# Patient Record
Sex: Female | Born: 1980 | Race: Black or African American | Hispanic: No | Marital: Single | Smoking: Never smoker
Health system: Southern US, Community
[De-identification: ages and names within clinical notes are randomized; demographics above are authoritative.]

## PROBLEM LIST (undated history)

## (undated) DIAGNOSIS — B009 Herpesviral infection, unspecified: Secondary | ICD-10-CM

## (undated) DIAGNOSIS — F909 Attention-deficit hyperactivity disorder, unspecified type: Secondary | ICD-10-CM

## (undated) HISTORY — PX: HYSTEROSCOPY: SHX211

## (undated) HISTORY — PX: OTHER SURGICAL HISTORY: SHX169

## (undated) HISTORY — DX: Herpesviral infection, unspecified: B00.9

---

## 2000-06-16 ENCOUNTER — Other Ambulatory Visit: Admission: RE | Admit: 2000-06-16 | Discharge: 2000-06-16 | Payer: Self-pay | Admitting: Gynecology

## 2001-10-17 ENCOUNTER — Other Ambulatory Visit: Admission: RE | Admit: 2001-10-17 | Discharge: 2001-10-17 | Payer: Self-pay | Admitting: Gynecology

## 2002-10-31 ENCOUNTER — Other Ambulatory Visit: Admission: RE | Admit: 2002-10-31 | Discharge: 2002-10-31 | Payer: Self-pay | Admitting: Obstetrics and Gynecology

## 2004-12-05 ENCOUNTER — Other Ambulatory Visit: Admission: RE | Admit: 2004-12-05 | Discharge: 2004-12-05 | Payer: Self-pay | Admitting: Gynecology

## 2006-07-01 ENCOUNTER — Other Ambulatory Visit: Admission: RE | Admit: 2006-07-01 | Discharge: 2006-07-01 | Payer: Self-pay | Admitting: Gynecology

## 2010-03-02 HISTORY — PX: INTRAUTERINE DEVICE INSERTION: SHX323

## 2011-09-22 ENCOUNTER — Encounter: Payer: Self-pay | Admitting: *Deleted

## 2011-09-22 DIAGNOSIS — B009 Herpesviral infection, unspecified: Secondary | ICD-10-CM | POA: Insufficient documentation

## 2011-09-23 ENCOUNTER — Encounter: Payer: Self-pay | Admitting: Women's Health

## 2011-09-23 ENCOUNTER — Ambulatory Visit (INDEPENDENT_AMBULATORY_CARE_PROVIDER_SITE_OTHER): Payer: 59 | Admitting: Women's Health

## 2011-09-23 VITALS — BP 130/70 | Ht 69.0 in | Wt 186.0 lb

## 2011-09-23 DIAGNOSIS — Z975 Presence of (intrauterine) contraceptive device: Secondary | ICD-10-CM

## 2011-09-23 DIAGNOSIS — N898 Other specified noninflammatory disorders of vagina: Secondary | ICD-10-CM

## 2011-09-23 DIAGNOSIS — IMO0001 Reserved for inherently not codable concepts without codable children: Secondary | ICD-10-CM | POA: Insufficient documentation

## 2011-09-23 LAB — WET PREP FOR TRICH, YEAST, CLUE: Clue Cells Wet Prep HPF POC: NONE SEEN

## 2011-09-23 MED ORDER — TERCONAZOLE 0.8 % VA CREA: 1.0000 | TOPICAL_CREAM | Freq: Every day | VAGINAL | Status: AC

## 2011-09-23 NOTE — Progress Notes (Signed)
Patient ID: Renee Clark, female   DOB: [birth year], 31 y.o.   MRN: [id number] Presents with the complaint of vaginal discharge with irritation/ discomfort especially after intercourse. Having regular monthly 6 day cycle first 2 days heavy flow, ParaGard IUD placed 2012. Last seen in our office in 2008, has delivered 2 sons since that time ages 42 months and 3. Third child weighed 9 pounds. Denies discharge with an odor or itching. States Pap smear after delivery was ascus, with no treatment, will have records sent.  Exam: +1 asymptomatic cystocele. External genitalia slightly erythematous, speculum exam ParaGard IUD strings visible, wet prep positive for moderate yeast. Bimanual no CMT or adnexal fullness or tenderness.  Yeast vaginitis  Plan: Terazol 3 one applicator at bedtime x3, prescription, proper use given and reviewed. Schedule annual exam and have records sent. Instructed  to call if no relief.

## 2011-10-07 ENCOUNTER — Encounter: Payer: Self-pay | Admitting: Women's Health

## 2011-10-07 ENCOUNTER — Ambulatory Visit (INDEPENDENT_AMBULATORY_CARE_PROVIDER_SITE_OTHER): Payer: 59 | Admitting: Women's Health

## 2011-10-07 ENCOUNTER — Other Ambulatory Visit (HOSPITAL_COMMUNITY)
Admission: RE | Admit: 2011-10-07 | Discharge: 2011-10-07 | Disposition: A | Discharge status: Home / Self Care | Payer: 59 | Source: Ambulatory Visit | Attending: Women's Health | Admitting: Women's Health

## 2011-10-07 VITALS — BP 128/74

## 2011-10-07 DIAGNOSIS — Z1151 Encounter for screening for human papillomavirus (HPV): Secondary | ICD-10-CM | POA: Insufficient documentation

## 2011-10-07 DIAGNOSIS — Z01419 Encounter for gynecological examination (general) (routine) without abnormal findings: Secondary | ICD-10-CM | POA: Insufficient documentation

## 2011-10-07 LAB — CBC WITH DIFFERENTIAL/PLATELET
Basophils Relative: 0 % (ref 0–1)
HCT: 36.8 % (ref 36.0–46.0)
Hemoglobin: 12.2 g/dL (ref 12.0–15.0)
Lymphocytes Relative: 41 % (ref 12–46)
Monocytes Absolute: 0.4 10*3/uL (ref 0.1–1.0)
Monocytes Relative: 7 % (ref 3–12)
Neutro Abs: 2.5 10*3/uL (ref 1.7–7.7)
Neutrophils Relative %: 51 % (ref 43–77)
RBC: 4.14 MIL/uL (ref 3.87–5.11)
WBC: 4.9 10*3/uL (ref 4.0–10.5)

## 2011-10-07 NOTE — Patient Instructions (Addendum)

## 2011-10-07 NOTE — Addendum Note (Signed)
Addended byValeda Malm L on: 10/07/2011 04:52 PM   Modules accepted: Orders

## 2011-10-07 NOTE — Progress Notes (Signed)
Antonina Deziel 22-Jan-1981 [id number]    History:    The patient presents for annual exam.  Monthly 4-5 day cycle ParaGard IUD 2012. Pap ascus with negative HR HPV 2012, Paps prior normal. History of HSV/rare outbreaks.   Past medical history, past surgical history, family history and social history were all reviewed and documented in the EPIC chart. 3 children, Aria 8, Adam 3, Caleb 14 months all doing well. Works from home.   ROS:  A  ROS was performed and pertinent positives and negatives are included in the history.  Exam:  Filed Vitals:   10/07/11 1518  BP: 128/74    General appearance:  Normal Head/Neck:  Normal, without cervical or supraclavicular adenopathy. Thyroid:  Symmetrical, normal in size, without palpable masses or nodularity. Respiratory  Effort:  Normal  Auscultation:  Clear without wheezing or rhonchi Cardiovascular  Auscultation:  Regular rate, without rubs, murmurs or gallops  Edema/varicosities:  Not grossly evident Abdominal  Soft,nontender, without masses, guarding or rebound.  Liver/spleen:  No organomegaly noted  Hernia:  None appreciated  Skin  Inspection:  Grossly normal  Palpation:  Grossly normal Neurologic/psychiatric  Orientation:  Normal with appropriate conversation.  Mood/affect:  Normal  Genitourinary    Breasts: Examined lying and sitting.     Right: Without masses, retractions, discharge or axillary adenopathy.     Left: Without masses, retractions, discharge or axillary adenopathy.   Inguinal/mons:  Normal without inguinal adenopathy  External genitalia:  Normal  BUS/Urethra/Skene's glands:  Normal  Bladder:  Normal  Vagina:  Normal  Cervix:  Normal/IUD strings visible  Uterus:  normal in size, shape and contour.  Midline and mobile  Adnexa/parametria:     Rt: Without masses or tenderness.   Lt: Without masses or tenderness.  Anus and perineum: Normal  Digital rectal exam: Normal sphincter tone without palpated masses or  tenderness  Assessment/Plan:  31 y.o. MBF G5 P3 for annual exam with no complaints.  History of ascus with negative HR HPV 2012, Paps normal prior ParaGard IUD 2012  Plan: Reviewed new screening guidelines to repeat Pap in 2 years , Patient concerned and requested to have repeated today. Pap done reviewed if normal repeat Pap in 3 years. SBE's, exercise, calcium rich diet, vitamin D 1000 daily encouraged. CBC, UAHarrington Challenger [facility], 4:38 PM 10/07/2011

## 2011-10-08 LAB — URINALYSIS W MICROSCOPIC + REFLEX CULTURE
Bilirubin Urine: NEGATIVE
Crystals: NONE SEEN
Glucose, UA: NEGATIVE mg/dL
Leukocytes, UA: NEGATIVE
Protein, ur: NEGATIVE mg/dL
Specific Gravity, Urine: 1.016 (ref 1.005–1.030)
Squamous Epithelial / LPF: NONE SEEN
Urobilinogen, UA: 0.2 mg/dL (ref 0.0–1.0)

## 2012-03-29 ENCOUNTER — Ambulatory Visit (INDEPENDENT_AMBULATORY_CARE_PROVIDER_SITE_OTHER): Payer: 59 | Admitting: Gynecology

## 2012-03-29 ENCOUNTER — Encounter: Payer: Self-pay | Admitting: Gynecology

## 2012-03-29 DIAGNOSIS — N751 Abscess of Bartholin's gland: Secondary | ICD-10-CM

## 2012-03-29 DIAGNOSIS — N764 Abscess of vulva: Secondary | ICD-10-CM

## 2012-03-29 MED ORDER — HYDROCODONE-ACETAMINOPHEN 5-500 MG PO TABS: 1.0000 | ORAL_TABLET | Freq: Four times a day (QID) | ORAL | Status: DC | PRN

## 2012-03-29 MED ORDER — MUPIROCIN CALCIUM 2 % EX CREA: TOPICAL_CREAM | Freq: Three times a day (TID) | CUTANEOUS | Status: DC

## 2012-03-29 MED ORDER — DOXYCYCLINE HYCLATE 100 MG PO CAPS: ORAL_CAPSULE | ORAL | Status: DC

## 2012-03-29 NOTE — Patient Instructions (Addendum)
Abscess An abscess is an infected area that contains a collection of pus and debris. It can occur in almost any part of the body. An abscess is also known as a furuncle or boil. CAUSES   An abscess occurs when tissue gets infected. This can occur from blockage of oil or sweat glands, infection of hair follicles, or a minor injury to the skin. As the body tries to fight the infection, pus collects in the area and creates pressure under the skin. This pressure causes pain. People with weakened immune systems have difficulty fighting infections and get certain abscesses more often.   SYMPTOMS Usually an abscess develops on the skin and becomes a painful mass that is red, warm, and tender. If the abscess forms under the skin, you may feel a moveable soft area under the skin. Some abscesses break open (rupture) on their own, but most will continue to get worse without care. The infection can spread deeper into the body and eventually into the bloodstream, causing you to feel ill.   DIAGNOSIS   Your caregiver will take your medical history and perform a physical exam. A sample of fluid may also be taken from the abscess to determine what is causing your infection. TREATMENT   Your caregiver may prescribe antibiotic medicines to fight the infection. However, taking antibiotics alone usually does not cure an abscess. Your caregiver may need to make a small cut (incision) in the abscess to drain the pus. In some cases, gauze is packed into the abscess to reduce pain and to continue draining the area. HOME CARE INSTRUCTIONS    Only take over-the-counter or prescription medicines for pain, discomfort, or fever as directed by your caregiver.   If you were prescribed antibiotics, take them as directed. Finish them even if you start to feel better.   If gauze is used, follow your caregiver's directions for changing the gauze.   To avoid spreading the infection:   Keep your draining abscess covered with a  bandage.   Wash your hands well.   Do not share personal care items, towels, or whirlpools with others.   Avoid skin contact with others.   Keep your skin and clothes clean around the abscess.   Keep all follow-up appointments as directed by your caregiver.  SEEK MEDICAL CARE IF:    You have increased pain, swelling, redness, fluid drainage, or bleeding.   You have muscle aches, chills, or a general ill feeling.   You have a fever.  MAKE SURE YOU:    Understand these instructions.   Will watch your condition.   Will get help right away if you are not doing well or get worse.  Document Released: 11/26/2004 Document Revised: 08/18/2011 Document Reviewed: 05/01/2011 Exit[employer] Patient Information 2013 [employer].    

## 2012-03-30 NOTE — Progress Notes (Addendum)
Patient is a 32 year old who presented to the office today complaining of right vaginal swelling and tenderness. Patient stated started Friday and has gotten worse. She denied any fever. She has a ParaGard IUD for contraception. She had a normal gynecological examination August 2013.  Exam: A large right inferior labial majora abscess measuring about 3 x 4 cm erythematous with a small center defect with purulent material begin to drain.  The area was cleansed with Betadine solution. 1% lidocaine was infiltrated subdermally and a small stab incision was made with a sterile scalpel and pertinent material begin to extrude. Cultures for MRSA were obtained. With a sterile curved hemostat the loculations were broken down. The defect was copiously irrigated with hydrogen peroxide. A small thin new gauze was inserted into the defect for additional hemostasis.  Assessment/plan: Right labial at this status post I&D in the office today. Patient will remove the new gauze wick tomorrow at home. She will be started on Vibramycin 100 mg twice a day for 2 weeks. She will clean the area twice a day with antibacterial soap and applied Bactroban or Neosporin cream to 3 times a day. She will apply ice pack to the area today and tomorrow. She will return back to the office in 2 weeks for followup.

## 2012-03-31 ENCOUNTER — Encounter: Payer: Self-pay | Admitting: Women's Health

## 2012-03-31 ENCOUNTER — Telehealth: Payer: Self-pay

## 2012-03-31 ENCOUNTER — Ambulatory Visit (INDEPENDENT_AMBULATORY_CARE_PROVIDER_SITE_OTHER): Payer: 59 | Admitting: Women's Health

## 2012-03-31 DIAGNOSIS — R110 Nausea: Secondary | ICD-10-CM

## 2012-03-31 DIAGNOSIS — L0390 Cellulitis, unspecified: Secondary | ICD-10-CM

## 2012-03-31 DIAGNOSIS — L0291 Cutaneous abscess, unspecified: Secondary | ICD-10-CM

## 2012-03-31 MED ORDER — ONDANSETRON HCL 4 MG PO TABS: 4.0000 mg | ORAL_TABLET | Freq: Three times a day (TID) | ORAL | Status: DC | PRN

## 2012-03-31 NOTE — Telephone Encounter (Signed)
Per Dr. Glenetta Hew staff message request: Please inform patient that her culture for her labial abscess demonstrated Staphylococcus aureus and she is on the right antibiotic. Tell her that I hope that she is feeling better and that she was able to remove the gauze wick and is taking her antibiotic as instructed. She is scheduled to see me in 2 weeks.   Patient was informed and proceeded to explain that she is feeling bad. 101 fever last night. Her labia is swollen as well as some redness and swelling in the pelvic area.  She had called earlier and is scheduled to see [place] at 3:00pm  I checked with Dr. Glenetta Hew to see if that was okay with him as he did procedure and he said that would be okay that Renee Clark could get him to come in the visit if she needed him to.  Patient advised we will see her at 3pm.

## 2012-03-31 NOTE — Progress Notes (Signed)
Patient ID: Renee Clark, female   DOB: 1980/03/09, 32 y.o.   MRN: [id number] Presents with complaint of increased swelling and discomfort in right labia extending to suprapubic area. States has had nausea and low-grade fever and has used Tylenol. Vomited one time. Taking Vibramycin twice daily. I&D right labial abscess 03/28/12 per Dr. Lily Peer.  Exam: Appears well. Dr. Lily Peer in and examined. Right labia edematous, tender, tissue soft extending to suprapubic area, small amount of a yellow drainage noted, no odor. Less erythematous then 1/27.  Right labial abscess culture positive for staph aureus  Plan: Continue Vibramycin 100 twice daily for 2 weeks. Prescription for Zofran 4 mg if continued nausea, prescription, proper use given and reviewed. Return to office in 4 days for followup. Instructed to continue sitz baths twice daily alternate with ice, Bactroban to open area, loose clothes.

## 2012-04-01 LAB — WOUND CULTURE

## 2012-04-06 ENCOUNTER — Telehealth: Payer: Self-pay | Admitting: *Deleted

## 2012-04-06 NOTE — Telephone Encounter (Signed)
Telephone call, states took one of the capsules and feels like it did not go down the entire way and has had a discomfort in the esophagus for several days, will try over-the-counter Maalox instructed to call if symptoms persist. No nausea or vomiting. States labial swelling has gone down tremendously and area less painful.

## 2012-04-06 NOTE — Telephone Encounter (Signed)
Pt is currently taking Vibramycin 100 mg twice a day for 2 weeks, pt took a pill later in the evening and thinks that it didn't dissolve completely. Pt is now having discomfort when eating & drinking. She also has some chest discomfort that feels like heartburn, but no burning this is what she could relate the discomfort to. Pt asked if there is anything she could do relieve this? Please advise

## 2012-04-09 ENCOUNTER — Emergency Department (HOSPITAL_COMMUNITY): Payer: 59

## 2012-04-09 ENCOUNTER — Other Ambulatory Visit: Payer: Self-pay

## 2012-04-09 ENCOUNTER — Emergency Department (HOSPITAL_COMMUNITY)
Admission: EM | Admit: 2012-04-09 | Discharge: 2012-04-09 | Disposition: A | Discharge status: Home / Self Care | Payer: 59 | Attending: Emergency Medicine | Admitting: Emergency Medicine

## 2012-04-09 ENCOUNTER — Encounter (HOSPITAL_COMMUNITY): Payer: Self-pay | Admitting: *Deleted

## 2012-04-09 DIAGNOSIS — R059 Cough, unspecified: Secondary | ICD-10-CM | POA: Insufficient documentation

## 2012-04-09 DIAGNOSIS — K2970 Gastritis, unspecified, without bleeding: Secondary | ICD-10-CM | POA: Insufficient documentation

## 2012-04-09 DIAGNOSIS — B009 Herpesviral infection, unspecified: Secondary | ICD-10-CM | POA: Insufficient documentation

## 2012-04-09 DIAGNOSIS — K2990 Gastroduodenitis, unspecified, without bleeding: Secondary | ICD-10-CM | POA: Insufficient documentation

## 2012-04-09 DIAGNOSIS — K209 Esophagitis, unspecified without bleeding: Secondary | ICD-10-CM | POA: Insufficient documentation

## 2012-04-09 DIAGNOSIS — R0602 Shortness of breath: Secondary | ICD-10-CM | POA: Insufficient documentation

## 2012-04-09 DIAGNOSIS — Z792 Long term (current) use of antibiotics: Secondary | ICD-10-CM | POA: Insufficient documentation

## 2012-04-09 DIAGNOSIS — R05 Cough: Secondary | ICD-10-CM | POA: Insufficient documentation

## 2012-04-09 DIAGNOSIS — Z79899 Other long term (current) drug therapy: Secondary | ICD-10-CM | POA: Insufficient documentation

## 2012-04-09 LAB — CBC
Hemoglobin: 12.6 g/dL (ref 12.0–15.0)
MCH: 28.9 pg (ref 26.0–34.0)
MCHC: 32.9 g/dL (ref 30.0–36.0)
MCV: 87.8 fL (ref 78.0–100.0)
RBC: 4.36 MIL/uL (ref 3.87–5.11)

## 2012-04-09 LAB — BASIC METABOLIC PANEL
BUN: 9 mg/dL (ref 6–23)
CO2: 23 mEq/L (ref 19–32)
Calcium: 9.6 mg/dL (ref 8.4–10.5)
Creatinine, Ser: 0.71 mg/dL (ref 0.50–1.10)
GFR calc non Af Amer: >90 mL/min (ref >90)
Glucose, Bld: 93 mg/dL (ref 70–99)

## 2012-04-09 MED ORDER — SUCRALFATE 1 GM/10ML PO SUSP: 1.0000 g | Freq: Four times a day (QID) | ORAL | Status: DC

## 2012-04-09 MED ORDER — GI COCKTAIL ~~LOC~~
30.0000 mL | Freq: Once | ORAL | Status: AC
  Administered 2012-04-09: 30 mL via ORAL
  Filled 2012-04-09: qty 30

## 2012-04-09 MED ORDER — RANITIDINE HCL 150 MG PO TABS: 150.0000 mg | ORAL_TABLET | Freq: Two times a day (BID) | ORAL | Status: DC

## 2012-04-09 NOTE — ED Notes (Signed)
Pt states understanding of discharge instructions 

## 2012-04-09 NOTE — ED Provider Notes (Signed)
History     CSN: [id number]  Arrival date & time 04/09/12  1455   First MD Initiated Contact with Patient 04/09/12 1929      Chief Complaint  Patient presents with  . Chest Pain  . Shortness of Breath    (Consider location/radiation/quality/duration/timing/severity/associated sxs/prior treatment) Patient is a 32 y.o. female presenting with chest pain and shortness of breath. The history is provided by the patient.  Chest Pain Pain location:  Substernal area and epigastric Pain quality: aching, burning and sharp   Pain radiates to:  Does not radiate Pain radiates to the back: no   Pain severity:  Severe Onset quality:  Gradual Duration:  5 days Timing:  Constant Progression:  Worsening Chronicity:  New (Started 5 days ago after she felt like her doxycycline tablet stuck in her throat) Context: eating   Context comment:  Every time she eats and swallows she has a burning and gravelly sensation in her distal chest and epigastric area Relieved by: Attempted Maalox with little help. Exacerbated by: Eating. Ineffective treatments: maalox. Associated symptoms: anorexia, cough and shortness of breath   Associated symptoms: no abdominal pain, no back pain, no fever, no nausea and not vomiting   Risk factors comment:  Hx of GERD while pregnant and states this is a similar feeling but worse Shortness of Breath Severity:  Mild Onset quality:  Gradual Timing:  Intermittent Progression:  Unchanged Chronicity:  New Relieved by:  Nothing Associated symptoms: chest pain and cough   Associated symptoms: no abdominal pain, no fever and no vomiting     Past Medical History  Diagnosis Date  . HSV (herpes simplex virus) infection     Past Surgical History  Procedure Laterality Date  . Intrauterine device insertion  2012    paraguard    Family History  Problem Relation Age of Onset  . Breast cancer Maternal Aunt   . Hypertension Mother   . Diabetes Maternal Grandfather   .  Hypertension Maternal Grandfather     History  Substance Use Topics  . Smoking status: Never Smoker   . Smokeless tobacco: Never Used  . Alcohol Use: Yes     Comment: social    OB History   Grav Para Term Preterm Abortions TAB SAB Ect Mult Living   5    2     3       Review of Systems  Constitutional: Negative for fever.  Respiratory: Positive for cough and shortness of breath.   Cardiovascular: Positive for chest pain.  Gastrointestinal: Positive for anorexia. Negative for nausea, vomiting and abdominal pain.  Musculoskeletal: Negative for back pain.  All other systems reviewed and are negative.    Allergies  Review of patient's allergies indicates no known allergies.  Home Medications   Current Outpatient Rx  Name  Route  Sig  Dispense  Refill  . doxycycline (VIBRAMYCIN) 100 MG capsule      Take one tablet twice a day for two weeks   28 capsule   0   . HYDROcodone-acetaminophen (VICODIN) 5-500 MG per tablet   Oral   Take 1 tablet by mouth every 6 (six) hours as needed for pain. Take one every 4-6 hours as needed   30 tablet   0   . mupirocin cream (BACTROBAN) 2 %   Topical   Apply topically 3 (three) times daily.   15 g   0   . ondansetron (ZOFRAN) 4 MG tablet   Oral   Take 1  tablet (4 mg total) by mouth every 8 (eight) hours as needed for nausea.   20 tablet   0     BP 141/95  Pulse 88  Temp(Src) 98.8 F (37.1 C) (Oral)  Resp 17  SpO2 100%  LMP 03/14/2012  Physical Exam  Nursing note and vitals reviewed. Constitutional: She is oriented to person, place, and time. She appears well-developed and well-nourished. No distress.  HENT:  Head: Normocephalic and atraumatic.  Mouth/Throat: Oropharynx is clear and moist.  Eyes: Conjunctivae and EOM are normal. Pupils are equal, round, and reactive to light.  Neck: Normal range of motion. Neck supple.  Cardiovascular: Normal rate, regular rhythm and intact distal pulses.   No murmur  heard. Pulmonary/Chest: Effort normal and breath sounds normal. No respiratory distress. She has no wheezes. She has no rales. She exhibits no tenderness.  Abdominal: Soft. She exhibits no distension. There is no tenderness. There is no rebound and no guarding.  Musculoskeletal: Normal range of motion. She exhibits no edema and no tenderness.  Neurological: She is alert and oriented to person, place, and time.  Skin: Skin is warm and dry. No rash noted. No erythema.  Psychiatric: She has a normal mood and affect. Her behavior is normal.    ED Course  Procedures (including critical care time)  Labs Reviewed  BASIC METABOLIC PANEL - Abnormal; Notable for the following:    Sodium 134 (*)    All other components within normal limits  CBC  POCT I-STAT TROPONIN I   Dg Chest 2 View  04/09/2012  *RADIOLOGY REPORT*  Clinical Data: Chest pain, shortness of breath  CHEST - 2 VIEW  Comparison: None.  Findings: Lungs are clear. No pleural effusion or pneumothorax.  Cardiomediastinal silhouette is within normal limits.  Visualized osseous structures are within normal limits.  IMPRESSION: Normal chest radiographs.   Original Report Authenticated By: Charline Bills, M.D.      Date: 04/09/2012  Rate: 84  Rhythm: normal sinus rhythm  QRS Axis: normal  Intervals: normal  ST/T Wave abnormalities: normal  Conduction Disutrbances: none  Narrative Interpretation: unremarkable     1. Esophagitis   2. Gastritis       MDM   Patient with symptoms most consistent with pill esophagitis, gastritis and reflux. She recently started Doxy 6 days ago after having an I&D for a genital abscess. She states 5 days prior to arrival when she was taking the doxycycline it felt like it got stuck in her esophagus. Since that time she has had painful swallowing, chest pain worse with lying down and as the taste in her mouth. Mild shortness of breath and a nonproductive cough. She has no palpable abdominal pain and no  palpable chest pain. She has normal vital signs other than when she initially presented had a temperature of 100.2 and a pulse of 102. On recheck her pulse was normal and temperature were normal.  EKG within normal limits. Labs done in triage at [facility], CBC, i-STAT troponin were all within normal limits. Chest x-ray pending. Low suspicion for esophageal perforation given and has now been going on for 5 days and patient still is well-appearing. Feel she had a perforation of her esophagus she would be extremely ill by now. Feel this is most likely reflux. Patient given GI cocktail and chest x-ray pending  9:18 PM Pt sx much improved after gi cocktail.  Will DC home with H2 blocker, Carafate and GI follow up if symptoms do not improve. She's going  to DC her doxycycline and avoid NSAIDs. Chest x-ray was within normal limits      Gwyneth Sprout, MD 04/09/12 2120

## 2012-04-09 NOTE — ED Notes (Signed)
Pt reports recently being on doxy for abscess, was lying down after taking it and thinks that it has caused irritation to her esophagus. Now having cp and sob. No acute distress noted at triage.

## 2012-04-12 ENCOUNTER — Ambulatory Visit: Payer: 59 | Admitting: Gynecology

## 2012-04-19 ENCOUNTER — Ambulatory Visit (INDEPENDENT_AMBULATORY_CARE_PROVIDER_SITE_OTHER): Payer: 59 | Admitting: Gynecology

## 2012-04-19 ENCOUNTER — Encounter: Payer: Self-pay | Admitting: Gynecology

## 2012-04-19 VITALS — BP 128/84

## 2012-04-19 DIAGNOSIS — N764 Abscess of vulva: Secondary | ICD-10-CM

## 2012-04-19 NOTE — Progress Notes (Signed)
Patient is a 32 year old who was seen in the office on 04/19/2012 and underwent incision and drainage of a right labial abscess. She was placed on Vibramycin 100 mg twice a day for a two-week course along with local debridement. She had to stop the medication after 11 days and was seen in the emergency room with esophagitis as a result of her taking antibiotics on an empty stomach and laying down. She was placed on Carafate and Zantac cyst. Her labs were normal in the emergency room. See separate encounter note from the emergency room. Patient is doing well otherwise. Results of the culture as follows:  Culture Abundant METHICILLIN RESISTANT STAPHYLOCOCCUS AUREUS     GRAM STAIN Rare     GRAM STAIN WBC present-predominately PMN     GRAM STAIN No Squamous Epithelial Cells Seen     GRAM STAIN Moderate Gram Positive Cocci In Pairs     Organism ID, Bacteria METHICILLIN RESISTANT STAPHYLOCOCCUS AUREUS     Comments: Rifampin and Gentamicin should not be used as single drugs for treatment of Staph infections.  Resulting Agency [facility]      Culture & Susceptibility        Antibiotic   Organism Organism Organism        METHICILLIN RESISTANT STAPHYLOCOCCUS AUREUS        CEFAZOLIN   R              CIPROFLOXACIN    <=0.5   S Final            CLINDAMYCIN    <=0.25   S Final            ERYTHROMYCIN    <=0.25   S Final            GENTAMICIN    <=0.5   S Final            LEVOFLOXACIN    0.25   S Final            LINEZOLID    2   S Final            OXACILLIN    >=4   R Final            PENICILLIN    >=0.5   R Final            RIFAMPIN    <=0.5   S Final            TETRACYCLINE    <=1   S Final            TRIMETH/SULFA    <=10   S Final            VANCOMYCIN    1   S Final           Exam: Bartholin urethra Skene was within normal limits Right labia soft edema and 98% decrease. No erythema was noted nontender and no inguinal lymphadenopathy. The rest of external genitalia otherwise  normal.  Assessment for/plan: Patient status post incision and drainage of right labial abscess in which the microorganisms isolated was MRSA. Patient on the appropriate antibiotic that she took for 11 days and area has completely healed. Patient has a ParaGard T380A IUD and is scheduled to return at the end of the year for her annual exam or when necessary.

## 2012-04-28 ENCOUNTER — Other Ambulatory Visit: Payer: Self-pay

## 2012-10-07 ENCOUNTER — Encounter: Payer: 59 | Admitting: Women's Health

## 2012-11-16 ENCOUNTER — Other Ambulatory Visit (HOSPITAL_COMMUNITY)
Admission: RE | Admit: 2012-11-16 | Discharge: 2012-11-16 | Disposition: A | Discharge status: Home / Self Care | Payer: 59 | Source: Ambulatory Visit | Attending: Women's Health | Admitting: Women's Health

## 2012-11-16 ENCOUNTER — Encounter: Payer: Self-pay | Admitting: Women's Health

## 2012-11-16 ENCOUNTER — Ambulatory Visit (INDEPENDENT_AMBULATORY_CARE_PROVIDER_SITE_OTHER): Payer: Medicaid Other | Admitting: Women's Health

## 2012-11-16 VITALS — BP 114/78 | Ht 69.0 in | Wt 165.6 lb

## 2012-11-16 DIAGNOSIS — Z113 Encounter for screening for infections with a predominantly sexual mode of transmission: Secondary | ICD-10-CM | POA: Insufficient documentation

## 2012-11-16 DIAGNOSIS — Z124 Encounter for screening for malignant neoplasm of cervix: Secondary | ICD-10-CM

## 2012-11-16 DIAGNOSIS — B009 Herpesviral infection, unspecified: Secondary | ICD-10-CM

## 2012-11-16 DIAGNOSIS — Z01419 Encounter for gynecological examination (general) (routine) without abnormal findings: Secondary | ICD-10-CM | POA: Insufficient documentation

## 2012-11-16 DIAGNOSIS — B379 Candidiasis, unspecified: Secondary | ICD-10-CM

## 2012-11-16 DIAGNOSIS — Z3009 Encounter for other general counseling and advice on contraception: Secondary | ICD-10-CM

## 2012-11-16 MED ORDER — TERCONAZOLE 0.4 % VA CREA: 1.0000 | TOPICAL_CREAM | Freq: Every day | VAGINAL | Status: DC

## 2012-11-16 MED ORDER — VALACYCLOVIR HCL 500 MG PO TABS: ORAL_TABLET | ORAL | Status: DC

## 2012-11-16 NOTE — Addendum Note (Signed)
Addended by: Richardson Chiquito on: 11/16/2012 03:10 PM   Modules accepted: Orders

## 2012-11-16 NOTE — Patient Instructions (Signed)

## 2012-11-16 NOTE — Progress Notes (Signed)
Renee Clark 30-Nov-1980 [id number]    History:    The patient presents for annual exam.  Regular monthly cycle, ParaGard IUD placed 08/2010. Pap ascus with negative HR HPV 2012. Pap normal with negative HR HPV 2013. Had normal labs at health screening at work. HSV rare outbreaks. Breakup with father of children, questions infidelity.   Past medical history, past surgical history, family history and social history were all reviewed and documented in the EPIC chart. Works at [employer]. Aria 9, Adam 4, Caleb 2, all doing well. Mother hypertension and diabetes. Maternal aunt breast cancer.   ROS:  A  ROS was performed and pertinent positives and negatives are included in the history.  Exam:  Filed Vitals:   11/16/12 1216  BP: 114/78    General appearance:  Normal Head/Neck:  Normal, without cervical or supraclavicular adenopathy. Thyroid:  Symmetrical, normal in size, without palpable masses or nodularity. Respiratory  Effort:  Normal  Auscultation:  Clear without wheezing or rhonchi Cardiovascular  Auscultation:  Regular rate, without rubs, murmurs or gallops  Edema/varicosities:  Not grossly evident Abdominal  Soft,nontender, without masses, guarding or rebound.  Liver/spleen:  No organomegaly noted  Hernia:  None appreciated  Skin  Inspection:  Grossly normal  Palpation:  Grossly normal Neurologic/psychiatric  Orientation:  Normal with appropriate conversation.  Mood/affect:  Normal  Genitourinary    Breasts: Examined lying and sitting.     Right: Without masses, retractions, discharge or axillary adenopathy.     Left: Without masses, retractions, discharge or axillary adenopathy.   Inguinal/mons:  Normal without inguinal adenopathy  External genitalia:  Normal  BUS/Urethra/Skene's glands:  Normal  Bladder:  Normal  Vagina:  Normal  Cervix:  Normal IUD strings visible  Uterus:   normal in size, shape and contour.  Midline and mobile  Adnexa/parametria:      Rt: Without masses or tenderness.   Lt: Without masses or tenderness.  Anus and perineum: Normal  Digital rectal exam: Normal sphincter tone without palpated masses or tenderness  Assessment/Plan:  32 y.o. SBF G3P3  for annual exam with occasional vaginal itching mostly external relieved by Monistat.  HSV-rare outbreaks ParaGard IUD 08/2010 STD screen  Plan: No visible erythema noted today, Terazol 7 prescription, proper use given and reviewed to use as needed for external itching. Office visit if no relief. Condoms encouraged if sexually active. Pap, GC/Chlamydia, HIV, hep B, C., RPR. SBE's, encouraged regular exercise is able, calcium rich diet, vitamin D 1000 daily encouraged. Denies need for counseling for long-term relationship breakup. Valtrex 500 twice daily 3-5 days as needed.   Harrington Challenger [facility], 12:43 PM 11/16/2012

## 2012-11-17 LAB — RPR

## 2012-11-17 LAB — HIV ANTIBODY (ROUTINE TESTING W REFLEX): HIV: NONREACTIVE

## 2014-01-01 ENCOUNTER — Encounter: Payer: Self-pay | Admitting: Women's Health

## 2014-02-15 ENCOUNTER — Other Ambulatory Visit: Payer: Self-pay | Admitting: Women's Health

## 2014-03-07 ENCOUNTER — Ambulatory Visit (INDEPENDENT_AMBULATORY_CARE_PROVIDER_SITE_OTHER): Payer: Commercial Managed Care - PPO | Admitting: Women's Health

## 2014-03-07 ENCOUNTER — Encounter: Payer: Self-pay | Admitting: Women's Health

## 2014-03-07 VITALS — BP 134/80 | Ht 69.0 in | Wt 173.0 lb

## 2014-03-07 DIAGNOSIS — Z01419 Encounter for gynecological examination (general) (routine) without abnormal findings: Secondary | ICD-10-CM | POA: Diagnosis not present

## 2014-03-07 DIAGNOSIS — N760 Acute vaginitis: Secondary | ICD-10-CM

## 2014-03-07 DIAGNOSIS — B9689 Other specified bacterial agents as the cause of diseases classified elsewhere: Secondary | ICD-10-CM

## 2014-03-07 DIAGNOSIS — A499 Bacterial infection, unspecified: Secondary | ICD-10-CM | POA: Diagnosis not present

## 2014-03-07 DIAGNOSIS — Z113 Encounter for screening for infections with a predominantly sexual mode of transmission: Secondary | ICD-10-CM

## 2014-03-07 LAB — WET PREP FOR TRICH, YEAST, CLUE
TRICH WET PREP: NONE SEEN
Yeast Wet Prep HPF POC: NONE SEEN

## 2014-03-07 MED ORDER — FLUCONAZOLE 150 MG PO TABS: 150.0000 mg | ORAL_TABLET | Freq: Once | ORAL | Status: DC

## 2014-03-07 MED ORDER — METRONIDAZOLE 500 MG PO TABS: 500.0000 mg | ORAL_TABLET | Freq: Two times a day (BID) | ORAL | Status: DC

## 2014-03-07 NOTE — Patient Instructions (Signed)
Health Maintenance Adopting a healthy lifestyle and getting preventive care can go a long way to promote health and wellness. Talk with your health care provider about what schedule of regular examinations is right for you. This is a good chance for you to check in with your provider about disease prevention and staying healthy. In between checkups, there are plenty of things you can do on your own. Experts have done a lot of research about which lifestyle changes and preventive measures are most likely to keep you healthy. Ask your health care provider for more information. WEIGHT AND DIET  Eat a healthy diet  Be sure to include plenty of vegetables, fruits, low-fat dairy products, and lean protein.  Do not eat a lot of foods high in solid fats, added sugars, or salt.  Get regular exercise. This is one of the most important things you can do for your health.  Most adults should exercise for at least 150 minutes each week. The exercise should increase your heart rate and make you sweat (moderate-intensity exercise).  Most adults should also do strengthening exercises at least twice a week. This is in addition to the moderate-intensity exercise.  Maintain a healthy weight  Body mass index (BMI) is a measurement that can be used to identify possible weight problems. It estimates body fat based on height and weight. Your health care provider can help determine your BMI and help you achieve or maintain a healthy weight.  For females 25 years of age and older:   A BMI below 18.5 is considered underweight.  A BMI of 18.5 to 24.9 is normal.  A BMI of 25 to 29.9 is considered overweight.  A BMI of 30 and above is considered obese.  Watch levels of cholesterol and blood lipids  You should start having your blood tested for lipids and cholesterol at 35 years of age, then have this test every 5 years.  You may need to have your cholesterol levels checked more often if:  Your lipid or  cholesterol levels are high.  You are older than 35 years of age.  You are at high risk for heart disease.  CANCER SCREENING   Lung Cancer  Lung cancer screening is recommended for adults 97-92 years old who are at high risk for lung cancer because of a history of smoking.  A yearly low-dose CT scan of the lungs is recommended for people who:  Currently smoke.  Have quit within the past 15 years.  Have at least a 30-pack-year history of smoking. A pack year is smoking an average of one pack of cigarettes a day for 1 year.  Yearly screening should continue until it has been 15 years since you quit.  Yearly screening should stop if you develop a health problem that would prevent you from having lung cancer treatment.  Breast Cancer  Practice breast self-awareness. This means understanding how your breasts normally appear and feel.  It also means doing regular breast self-exams. Let your health care provider know about any changes, no matter how small.  If you are in your 20s or 30s, you should have a clinical breast exam (CBE) by a health care provider every 1-3 years as part of a regular health exam.  If you are 76 or older, have a CBE every year. Also consider having a breast X-ray (mammogram) every year.  If you have a family history of breast cancer, talk to your health care provider about genetic screening.  If you are  at high risk for breast cancer, talk to your health care provider about having an MRI and a mammogram every year.  Breast cancer gene (BRCA) assessment is recommended for women who have family members with BRCA-related cancers. BRCA-related cancers include:  Breast.  Ovarian.  Tubal.  Peritoneal cancers.  Results of the assessment will determine the need for genetic counseling and BRCA1 and BRCA2 testing. Cervical Cancer Routine pelvic examinations to screen for cervical cancer are no longer recommended for nonpregnant women who are considered low  risk for cancer of the pelvic organs (ovaries, uterus, and vagina) and who do not have symptoms. A pelvic examination may be necessary if you have symptoms including those associated with pelvic infections. Ask your health care provider if a screening pelvic exam is right for you.   The Pap test is the screening test for cervical cancer for women who are considered at risk.  If you had a hysterectomy for a problem that was not cancer or a condition that could lead to cancer, then you no longer need Pap tests.  If you are older than 65 years, and you have had normal Pap tests for the past 10 years, you no longer need to have Pap tests.  If you have had past treatment for cervical cancer or a condition that could lead to cancer, you need Pap tests and screening for cancer for at least 20 years after your treatment.  If you no longer get a Pap test, assess your risk factors if they change (such as having a new sexual partner). This can affect whether you should start being screened again.  Some women have medical problems that increase their chance of getting cervical cancer. If this is the case for you, your health care provider may recommend more frequent screening and Pap tests.  The human papillomavirus (HPV) test is another test that may be used for cervical cancer screening. The HPV test looks for the virus that can cause cell changes in the cervix. The cells collected during the Pap test can be tested for HPV.  The HPV test can be used to screen women 30 years of age and older. Getting tested for HPV can extend the interval between normal Pap tests from three to five years.  An HPV test also should be used to screen women of any age who have unclear Pap test results.  After 35 years of age, women should have HPV testing as often as Pap tests.  Colorectal Cancer  This type of cancer can be detected and often prevented.  Routine colorectal cancer screening usually begins at 35 years of  age and continues through 35 years of age.  Your health care provider may recommend screening at an earlier age if you have risk factors for colon cancer.  Your health care provider may also recommend using home test kits to check for hidden blood in the stool.  A small camera at the end of a tube can be used to examine your colon directly (sigmoidoscopy or colonoscopy). This is done to check for the earliest forms of colorectal cancer.  Routine screening usually begins at age 50.  Direct examination of the colon should be repeated every 5-10 years through 35 years of age. However, you may need to be screened more often if early forms of precancerous polyps or small growths are found. Skin Cancer  Check your skin from head to toe regularly.  Tell your health care provider about any new moles or changes in   moles, especially if there is a change in a mole's shape or color.  Also tell your health care provider if you have a mole that is larger than the size of a pencil eraser.  Always use sunscreen. Apply sunscreen liberally and repeatedly throughout the day.  Protect yourself by wearing long sleeves, pants, a wide-brimmed hat, and sunglasses whenever you are outside. HEART DISEASE, DIABETES, AND HIGH BLOOD PRESSURE   Have your blood pressure checked at least every 1-2 years. High blood pressure causes heart disease and increases the risk of stroke.  If you are between 75 years and 42 years old, ask your health care provider if you should take aspirin to prevent strokes.  Have regular diabetes screenings. This involves taking a blood sample to check your fasting blood sugar level.  If you are at a normal weight and have a low risk for diabetes, have this test once every three years after 35 years of age.  If you are overweight and have a high risk for diabetes, consider being tested at a younger age or more often. PREVENTING INFECTION  Hepatitis B  If you have a higher risk for  hepatitis B, you should be screened for this virus. You are considered at high risk for hepatitis B if:  You were born in a country where hepatitis B is common. Ask your health care provider which countries are considered high risk.  Your parents were born in a high-risk country, and you have not been immunized against hepatitis B (hepatitis B vaccine).  You have HIV or AIDS.  You use needles to inject street drugs.  You live with someone who has hepatitis B.  You have had sex with someone who has hepatitis B.  You get hemodialysis treatment.  You take certain medicines for conditions, including cancer, organ transplantation, and autoimmune conditions. Hepatitis C  Blood testing is recommended for:  Everyone born from [birth year] through 1966.  Anyone with known risk factors for hepatitis C. Sexually transmitted infections (STIs)  You should be screened for sexually transmitted infections (STIs) including gonorrhea and chlamydia if:  You are sexually active and are younger than 35 years of age.  You are older than 35 years of age and your health care provider tells you that you are at risk for this type of infection.  Your sexual activity has changed since you were last screened and you are at an increased risk for chlamydia or gonorrhea. Ask your health care provider if you are at risk.  If you do not have HIV, but are at risk, it may be recommended that you take a prescription medicine daily to prevent HIV infection. This is called pre-exposure prophylaxis (PrEP). You are considered at risk if:  You are sexually active and do not regularly use condoms or know the HIV status of your partner(s).  You take drugs by injection.  You are sexually active with a partner who has HIV. Talk with your health care provider about whether you are at high risk of being infected with HIV. If you choose to begin PrEP, you should first be tested for HIV. You should then be tested every 3 months for  as long as you are taking PrEP.  PREGNANCY   If you are premenopausal and you may become pregnant, ask your health care provider about preconception counseling.  If you may become pregnant, take 400 to 800 micrograms (mcg) of folic acid every day.  If you want to prevent pregnancy, talk to your  health care provider about birth control (contraception). OSTEOPOROSIS AND MENOPAUSE   Osteoporosis is a disease in which the bones lose minerals and strength with aging. This can result in serious bone fractures. Your risk for osteoporosis can be identified using a bone density scan.  If you are 65 years of age or older, or if you are at risk for osteoporosis and fractures, ask your health care provider if you should be screened.  Ask your health care provider whether you should take a calcium or vitamin D supplement to lower your risk for osteoporosis.  Menopause may have certain physical symptoms and risks.  Hormone replacement therapy may reduce some of these symptoms and risks. Talk to your health care provider about whether hormone replacement therapy is right for you.  HOME CARE INSTRUCTIONS   Schedule regular health, dental, and eye exams.  Stay current with your immunizations.   Do not use any tobacco products including cigarettes, chewing tobacco, or electronic cigarettes.  If you are pregnant, do not drink alcohol.  If you are breastfeeding, limit how much and how often you drink alcohol.  Limit alcohol intake to no more than 1 drink per day for nonpregnant women. One drink equals 12 ounces of beer, 5 ounces of wine, or 1 ounces of hard liquor.  Do not use street drugs.  Do not share needles.  Ask your health care provider for help if you need support or information about quitting drugs.  Tell your health care provider if you often feel depressed.  Tell your health care provider if you have ever been abused or do not feel safe at home. Document Released: 09/01/2010  Document Revised: 07/03/2013 Document Reviewed: 01/18/2013 ExitCare Patient Information 2015 [employer]. This information is not intended to replace advice given to you by your health care provider. Make sure you discuss any questions you have with your health care provider. Bacterial Vaginosis Bacterial vaginosis is an infection of the vagina. It happens when too many of certain germs (bacteria) grow in the vagina. HOME CARE  Take your medicine as told by your doctor.  Finish your medicine even if you start to feel better.  Do not have sex until you finish your medicine and are better.  Tell your sex partner that you have an infection. They should see their doctor for treatment.  Practice safe sex. Use condoms. Have only one sex partner. GET HELP IF:  You are not getting better after 3 days of treatment.  You have more grey fluid (discharge) coming from your vagina than before.  You have more pain than before.  You have a fever. MAKE SURE YOU:   Understand these instructions.  Will watch your condition.  Will get help right away if you are not doing well or get worse. Document Released: 11/26/2007 Document Revised: 12/07/2012 Document Reviewed: 09/28/2012 Exit[employer] Patient Information 2015 [employer]. This information is not intended to replace advice given to you by your health care provider. Make sure you discuss any questions you have with your health care provider.  

## 2014-03-07 NOTE — Progress Notes (Signed)
Bertell MariaLagisha Siemen [birth year] [id number]    History:    Presents for annual exam.  Monthly 5-7 day cycle. ParaGard IUD placed 2012. New partner. Normal Pap history. 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  Past medical history, past surgical history, family history and social history were all reviewed and documented in the EPIC chart. 3 children ages 473, 5 and 9310 all doing well. Mother hypertension.2  ROS:  A ROS was performed and pertinent positives and negatives are included.  Exam:  Filed Vitals:   03/07/14 1618  BP: 134/80    General appearance:  Normal Thyroid:  Symmetrical, normal in size, without palpable masses or nodularity. Respiratory  Auscultation:  Clear without wheezing or rhonchi Cardiovascular  Auscultation:  Regular rate, without rubs, murmurs or gallops  Edema/varicosities:  Not grossly evident Abdominal  Soft,nontender, without masses, guarding or rebound.  Liver/spleen:  No organomegaly noted  Hernia:  None appreciated  Skin  Inspection:  Grossly normal   Breasts: Examined lying and sitting.     Right: Without masses, retractions, discharge or axillary adenopathy.     Left: Without masses, retractions, discharge or axillary adenopathy. Gentitourinary   Inguinal/mons:  Normal without inguinal adenopathy  External genitalia:  Normal  BUS/Urethra/Skene's glands:  Normal  Vagina:  Watery discharge with odor, wet prep positive for amines, clues, TNTC bacteria  Cervix:  Normal IUD strings visible  Uterus:   normal in size, shape and contour.  Midline and mobile  Adnexa/parametria:     Rt: Without masses or tenderness.   Lt: Without masses or tenderness.  Anus and perineum: Normal  Digital rectal exam: Normal sphincter tone without palpated masses or tenderness  Assessment/Plan:  34 y.o. SBF G5 P3 for annual exam with complaint of vaginal discharge.  Bacteria vaginosis STD screen Monthly cycle ParaGard IUD placed 2012 HSV rare  outbreaks  Plan: Flagyl 500 twice daily for 7 days, alcohol precautions reviewed. Instructed to call if no relief of symptoms. Valtrex 500 twice daily for 3-5 days if needed. Reports normal labs at health screening at work. UA, GC/Chlamydia, HIV, hep B, C, RPR, Pap normal 2014, new screening guidelines reviewed.  Harrington ChallengerYOUNG,Emberlie Gotcher J Center For Surgical Excellence IncWHNP, 5:24 PM 03/07/2014

## 2014-03-08 LAB — RPR

## 2014-03-08 LAB — GC/CHLAMYDIA PROBE AMP
CT PROBE, AMP APTIMA: NEGATIVE
GC PROBE AMP APTIMA: NEGATIVE

## 2014-03-08 LAB — HIV ANTIBODY (ROUTINE TESTING W REFLEX): HIV: NONREACTIVE

## 2014-03-08 LAB — HEPATITIS B SURFACE ANTIGEN: HEP B S AG: NEGATIVE

## 2014-03-08 LAB — HEPATITIS C ANTIBODY: HCV AB: NEGATIVE

## 2014-06-05 ENCOUNTER — Other Ambulatory Visit: Payer: Self-pay | Admitting: Women's Health

## 2014-06-08 ENCOUNTER — Ambulatory Visit: Payer: 59 | Admitting: Women's Health

## 2014-06-22 ENCOUNTER — Ambulatory Visit (INDEPENDENT_AMBULATORY_CARE_PROVIDER_SITE_OTHER): Payer: Commercial Managed Care - PPO | Admitting: Women's Health

## 2014-06-22 ENCOUNTER — Encounter: Payer: Self-pay | Admitting: Women's Health

## 2014-06-22 VITALS — BP 134/80 | Ht 69.0 in

## 2014-06-22 DIAGNOSIS — B9689 Other specified bacterial agents as the cause of diseases classified elsewhere: Secondary | ICD-10-CM | POA: Insufficient documentation

## 2014-06-22 DIAGNOSIS — N760 Acute vaginitis: Secondary | ICD-10-CM

## 2014-06-22 DIAGNOSIS — N898 Other specified noninflammatory disorders of vagina: Secondary | ICD-10-CM

## 2014-06-22 DIAGNOSIS — R350 Frequency of micturition: Secondary | ICD-10-CM

## 2014-06-22 DIAGNOSIS — A499 Bacterial infection, unspecified: Secondary | ICD-10-CM | POA: Diagnosis not present

## 2014-06-22 LAB — WET PREP FOR TRICH, YEAST, CLUE
Trich, Wet Prep: NONE SEEN
WBC WET PREP: NONE SEEN
Yeast Wet Prep HPF POC: NONE SEEN

## 2014-06-22 LAB — URINALYSIS W MICROSCOPIC + REFLEX CULTURE
Bilirubin Urine: NEGATIVE
GLUCOSE, UA: NEGATIVE mg/dL
HGB URINE DIPSTICK: NEGATIVE
Ketones, ur: NEGATIVE mg/dL
LEUKOCYTES UA: NEGATIVE
NITRITE: NEGATIVE
PH: 8.5 — AB (ref 5.0–8.0)
PROTEIN: 30 mg/dL — AB
Specific Gravity, Urine: 1.015 (ref 1.005–1.030)
Urobilinogen, UA: 0.2 mg/dL (ref 0.0–1.0)

## 2014-06-22 MED ORDER — METRONIDAZOLE 0.75 % VA GEL: VAGINAL | Status: DC

## 2014-06-22 NOTE — Patient Instructions (Signed)
Bacterial Vaginosis Bacterial vaginosis is an infection of the vagina. It happens when too many of certain germs (bacteria) grow in the vagina. HOME CARE  Take your medicine as told by your doctor.  Finish your medicine even if you start to feel better.  Do not have sex until you finish your medicine and are better.  Tell your sex partner that you have an infection. They should see their doctor for treatment.  Practice safe sex. Use condoms. Have only one sex partner. GET HELP IF:  You are not getting better after 3 days of treatment.  You have more grey fluid (discharge) coming from your vagina than before.  You have more pain than before.  You have a fever. MAKE SURE YOU:   Understand these instructions.  Will watch your condition.  Will get help right away if you are not doing well or get worse. Document Released: 11/26/2007 Document Revised: 12/07/2012 Document Reviewed: 09/28/2012 ExitCare Patient Information 2015 [employer]. This information is not intended to replace advice given to you by your health care provider. Make sure you discuss any questions you have with your health care provider.  

## 2014-06-22 NOTE — Progress Notes (Signed)
Patient ID: Renee Clark Renee Clark, female   DOB: May 13, 1980, 34 y.o.   MRN: [id number] Presents with complaint of increased vaginal discharge with odor and irritation. Same partner with negative STD screen. Monthly cycle with ParaGard IUD placed 2012. Treated for bacterial vaginosis in January here and again in March at place of employment. Denies urinary symptoms, abdominal pain or fever.  Exam: Appears well. External genitalia +1 cystocele asymptomatic. Speculum exam moderate amount of white adherent discharge with odor noted, wet prep positive for amines, clues, TNTC bacteria. Bimanual no CMT or adnexal tenderness or fullness. IUD strings visible. UA: Negative  Recurrent Bacteria vaginosis  Plan: MetroGel vaginal cream 1 applicator at bedtime 10, alcohol precautions reviewed. Will start boric acid gelcaps 600 mg per vagina twice weekly after symptoms resolve.. Call if continued problems.

## 2014-07-25 ENCOUNTER — Encounter: Payer: Self-pay | Admitting: Women's Health

## 2014-07-25 ENCOUNTER — Ambulatory Visit (INDEPENDENT_AMBULATORY_CARE_PROVIDER_SITE_OTHER): Payer: Commercial Managed Care - PPO | Admitting: Women's Health

## 2014-07-25 VITALS — BP 122/80 | Ht 69.0 in | Wt 166.0 lb

## 2014-07-25 DIAGNOSIS — A499 Bacterial infection, unspecified: Secondary | ICD-10-CM

## 2014-07-25 DIAGNOSIS — N760 Acute vaginitis: Secondary | ICD-10-CM | POA: Diagnosis not present

## 2014-07-25 DIAGNOSIS — B9689 Other specified bacterial agents as the cause of diseases classified elsewhere: Secondary | ICD-10-CM

## 2014-07-25 DIAGNOSIS — N898 Other specified noninflammatory disorders of vagina: Secondary | ICD-10-CM

## 2014-07-25 LAB — WET PREP FOR TRICH, YEAST, CLUE
Clue Cells Wet Prep HPF POC: NONE SEEN
TRICH WET PREP: NONE SEEN
YEAST WET PREP: NONE SEEN

## 2014-07-25 MED ORDER — METRONIDAZOLE 0.75 % VA GEL: VAGINAL | Status: DC

## 2014-07-25 NOTE — Progress Notes (Signed)
Patient ID: Renee Clark, female   DOB: November 17, 1980, 34 y.o.   MRN: [id number] Presents with complaint of not being able to feel IUD strings and increased vaginal discharge without odor, states has had problems with recurrent BV in the past. Having a monthly cycle/ParaGard IUD. Same partner questions fidelity. Denies urinary symptoms, abdominal pain or fever.  Exam: Appears well. External genitalia +1 cystocele asymptomatic, speculum exam IUD strings visible, wet prep negative, GC/Chlamydia culture taken. Bimanual no CMT or adnexal fullness or tenderness.  Asymptomatic +1 cystocele. History of recurrent BV  Plan: GC/Chlamydia culture pending. Denies need for HIV, hepatitis or RPR. Encouraged condoms when sexually active. Will continue half an applicator of MetroGel went odor noted, reviewed normality of exam and wet prep. Reassured IUD strings present.

## 2014-07-26 LAB — GC/CHLAMYDIA PROBE AMP
CT Probe RNA: NEGATIVE
GC Probe RNA: NEGATIVE

## 2015-05-24 ENCOUNTER — Ambulatory Visit (INDEPENDENT_AMBULATORY_CARE_PROVIDER_SITE_OTHER): Payer: Commercial Managed Care - PPO | Admitting: Women's Health

## 2015-05-24 ENCOUNTER — Encounter: Payer: Self-pay | Admitting: Women's Health

## 2015-05-24 VITALS — BP 122/78

## 2015-05-24 DIAGNOSIS — N898 Other specified noninflammatory disorders of vagina: Secondary | ICD-10-CM

## 2015-05-24 DIAGNOSIS — L739 Follicular disorder, unspecified: Secondary | ICD-10-CM

## 2015-05-24 LAB — WET PREP FOR TRICH, YEAST, CLUE
Clue Cells Wet Prep HPF POC: NONE SEEN
Trich, Wet Prep: NONE SEEN
Yeast Wet Prep HPF POC: NONE SEEN

## 2015-05-24 MED ORDER — CEPHALEXIN 500 MG PO CAPS: 500.0000 mg | ORAL_CAPSULE | Freq: Three times a day (TID) | ORAL | Status: DC

## 2015-05-24 NOTE — Progress Notes (Signed)
Patient ID: Renee Clark, female   DOB: January 16, 1981, 35 y.o.   MRN: [id number] Presents with complaint of questionable swollen gland, skin infection in groin area for the past 2 weeks. History of labial abscess/MRSA. Denies fever, abdominal pain, vaginal discharge or urinary symptoms. Not sexually active. Monthly cycle ParaGard IUD placed 2012.  Exam: Appears well. External genitalia within normal limits, left inner groin 2 cm folliculitis mild tenderness. Speculum exam scant white discharge wet prep negative. IUD strings visible. Bimanual no CMT or adnexal tenderness.  Folliculitis/history of MRSA  Plan: Options reviewed of watching or antibiotic, will try Keflex 500 mg 3 times daily for 5 days, loose clothing, he clean and dry, Neosporin if opens. Started to call if area does not resolve or symptoms become more pronounced.

## 2015-05-24 NOTE — Patient Instructions (Signed)
Folliculitis °Folliculitis is redness, soreness, and swelling (inflammation) of the hair follicles. This condition can occur anywhere on the body. People with weakened immune systems, diabetes, or obesity have a greater risk of getting folliculitis. °CAUSES °· Bacterial infection. This is the most common cause. °· Fungal infection. °· Viral infection. °· Contact with certain chemicals, especially oils and tars. °Long-term folliculitis can result from bacteria that live in the nostrils. The bacteria may trigger multiple outbreaks of folliculitis over time. °SYMPTOMS °Folliculitis most commonly occurs on the scalp, thighs, legs, back, buttocks, and areas where hair is shaved frequently. An early sign of folliculitis is a small, white or yellow, pus-filled, itchy lesion (pustule). These lesions appear on a red, inflamed follicle. They are usually less than 0.2 inches (5 mm) wide. When there is an infection of the follicle that goes deeper, it becomes a boil or furuncle. A group of closely packed boils creates a larger lesion (carbuncle). Carbuncles tend to occur in hairy, sweaty areas of the body. °DIAGNOSIS  °Your caregiver can usually tell what is wrong by doing a physical exam. A sample may be taken from one of the lesions and tested in a lab. This can help determine what is causing your folliculitis. °TREATMENT  °Treatment may include: °· Applying warm compresses to the affected areas. °· Taking antibiotic medicines orally or applying them to the skin. °· Draining the lesions if they contain a large amount of pus or fluid. °· Laser hair removal for cases of long-lasting folliculitis. This helps to prevent regrowth of the hair. °HOME CARE INSTRUCTIONS °· Apply warm compresses to the affected areas as directed by your caregiver. °· If antibiotics are prescribed, take them as directed. Finish them even if you start to feel better. °· You may take over-the-counter medicines to relieve itching. °· Do not shave irritated  skin. °· Follow up with your caregiver as directed. °SEEK IMMEDIATE MEDICAL CARE IF:  °· You have increasing redness, swelling, or pain in the affected area. °· You have a fever. °MAKE SURE YOU: °· Understand these instructions. °· Will watch your condition. °· Will get help right away if you are not doing well or get worse. °  °This information is not intended to replace advice given to you by your health care provider. Make sure you discuss any questions you have with your health care provider. °  °Document Released: 04/27/2001 Document Revised: 03/09/2014 Document Reviewed: 05/19/2011 °[employer] Interactive Patient Education ©2016 [employer] ° °

## 2015-06-09 ENCOUNTER — Other Ambulatory Visit: Payer: Self-pay | Admitting: Women's Health

## 2015-06-12 ENCOUNTER — Encounter: Payer: Commercial Managed Care - PPO | Admitting: Women's Health

## 2015-06-18 ENCOUNTER — Encounter: Payer: Commercial Managed Care - PPO | Admitting: Women's Health

## 2015-06-18 DIAGNOSIS — Z0289 Encounter for other administrative examinations: Secondary | ICD-10-CM

## 2015-06-21 ENCOUNTER — Other Ambulatory Visit: Payer: Self-pay | Admitting: Women's Health

## 2015-11-20 ENCOUNTER — Encounter: Payer: Self-pay | Admitting: Women's Health

## 2015-11-20 ENCOUNTER — Ambulatory Visit (INDEPENDENT_AMBULATORY_CARE_PROVIDER_SITE_OTHER): Payer: Commercial Managed Care - PPO | Admitting: Women's Health

## 2015-11-20 VITALS — BP 128/80 | Ht 69.0 in | Wt 180.0 lb

## 2015-11-20 DIAGNOSIS — Z01419 Encounter for gynecological examination (general) (routine) without abnormal findings: Secondary | ICD-10-CM | POA: Diagnosis not present

## 2015-11-20 DIAGNOSIS — B9689 Other specified bacterial agents as the cause of diseases classified elsewhere: Secondary | ICD-10-CM

## 2015-11-20 DIAGNOSIS — B009 Herpesviral infection, unspecified: Secondary | ICD-10-CM | POA: Diagnosis not present

## 2015-11-20 DIAGNOSIS — Z113 Encounter for screening for infections with a predominantly sexual mode of transmission: Secondary | ICD-10-CM | POA: Diagnosis not present

## 2015-11-20 DIAGNOSIS — N760 Acute vaginitis: Secondary | ICD-10-CM | POA: Diagnosis not present

## 2015-11-20 DIAGNOSIS — A499 Bacterial infection, unspecified: Secondary | ICD-10-CM

## 2015-11-20 MED ORDER — METRONIDAZOLE 0.75 % VA GEL: VAGINAL | 0 refills | Status: DC

## 2015-11-20 MED ORDER — VALACYCLOVIR HCL 500 MG PO TABS: ORAL_TABLET | ORAL | 12 refills | Status: DC

## 2015-11-20 NOTE — Patient Instructions (Signed)
Health Maintenance, Female Adopting a healthy lifestyle and getting preventive care can go a long way to promote health and wellness. Talk with your health care provider about what schedule of regular examinations is right for you. This is a good chance for you to check in with your provider about disease prevention and staying healthy. In between checkups, there are plenty of things you can do on your own. Experts have done a lot of research about which lifestyle changes and preventive measures are most likely to keep you healthy. Ask your health care provider for more information. WEIGHT AND DIET  Eat a healthy diet  Be sure to include plenty of vegetables, fruits, low-fat dairy products, and lean protein.  Do not eat a lot of foods high in solid fats, added sugars, or salt.  Get regular exercise. This is one of the most important things you can do for your health.  Most adults should exercise for at least 150 minutes each week. The exercise should increase your heart rate and make you sweat (moderate-intensity exercise).  Most adults should also do strengthening exercises at least twice a week. This is in addition to the moderate-intensity exercise.  Maintain a healthy weight  Body mass index (BMI) is a measurement that can be used to identify possible weight problems. It estimates body fat based on height and weight. Your health care provider can help determine your BMI and help you achieve or maintain a healthy weight.  For females 20 years of age and older:   A BMI below 18.5 is considered underweight.  A BMI of 18.5 to 24.9 is normal.  A BMI of 25 to 29.9 is considered overweight.  A BMI of 30 and above is considered obese.  Watch levels of cholesterol and blood lipids  You should start having your blood tested for lipids and cholesterol at 36 years of age, then have this test every 5 years.  You may need to have your cholesterol levels checked more often if:  Your lipid  or cholesterol levels are high.  You are older than 36 years of age.  You are at high risk for heart disease.  CANCER SCREENING   Lung Cancer  Lung cancer screening is recommended for adults 55-80 years old who are at high risk for lung cancer because of a history of smoking.  A yearly low-dose CT scan of the lungs is recommended for people who:  Currently smoke.  Have quit within the past 15 years.  Have at least a 30-pack-year history of smoking. A pack year is smoking an average of one pack of cigarettes a day for 1 year.  Yearly screening should continue until it has been 15 years since you quit.  Yearly screening should stop if you develop a health problem that would prevent you from having lung cancer treatment.  Breast Cancer  Practice breast self-awareness. This means understanding how your breasts normally appear and feel.  It also means doing regular breast self-exams. Let your health care provider know about any changes, no matter how small.  If you are in your 20s or 30s, you should have a clinical breast exam (CBE) by a health care provider every 1-3 years as part of a regular health exam.  If you are 40 or older, have a CBE every year. Also consider having a breast X-ray (mammogram) every year.  If you have a family history of breast cancer, talk to your health care provider about genetic screening.  If you   are at high risk for breast cancer, talk to your health care provider about having an MRI and a mammogram every year.  Breast cancer gene (BRCA) assessment is recommended for women who have family members with BRCA-related cancers. BRCA-related cancers include:  Breast.  Ovarian.  Tubal.  Peritoneal cancers.  Results of the assessment will determine the need for genetic counseling and BRCA1 and BRCA2 testing. Cervical Cancer Your health care provider may recommend that you be screened regularly for cancer of the pelvic organs (ovaries, uterus, and  vagina). This screening involves a pelvic examination, including checking for microscopic changes to the surface of your cervix (Pap test). You may be encouraged to have this screening done every 3 years, beginning at age 21.  For women ages 30-65, health care providers may recommend pelvic exams and Pap testing every 3 years, or they may recommend the Pap and pelvic exam, combined with testing for human papilloma virus (HPV), every 5 years. Some types of HPV increase your risk of cervical cancer. Testing for HPV may also be done on women of any age with unclear Pap test results.  Other health care providers may not recommend any screening for nonpregnant women who are considered low risk for pelvic cancer and who do not have symptoms. Ask your health care provider if a screening pelvic exam is right for you.  If you have had past treatment for cervical cancer or a condition that could lead to cancer, you need Pap tests and screening for cancer for at least 20 years after your treatment. If Pap tests have been discontinued, your risk factors (such as having a new sexual partner) need to be reassessed to determine if screening should resume. Some women have medical problems that increase the chance of getting cervical cancer. In these cases, your health care provider may recommend more frequent screening and Pap tests. Colorectal Cancer  This type of cancer can be detected and often prevented.  Routine colorectal cancer screening usually begins at 36 years of age and continues through 36 years of age.  Your health care provider may recommend screening at an earlier age if you have risk factors for colon cancer.  Your health care provider may also recommend using home test kits to check for hidden blood in the stool.  A small camera at the end of a tube can be used to examine your colon directly (sigmoidoscopy or colonoscopy). This is done to check for the earliest forms of colorectal  cancer.  Routine screening usually begins at age 50.  Direct examination of the colon should be repeated every 5-10 years through 36 years of age. However, you may need to be screened more often if early forms of precancerous polyps or small growths are found. Skin Cancer  Check your skin from head to toe regularly.  Tell your health care provider about any new moles or changes in moles, especially if there is a change in a mole's shape or color.  Also tell your health care provider if you have a mole that is larger than the size of a pencil eraser.  Always use sunscreen. Apply sunscreen liberally and repeatedly throughout the day.  Protect yourself by wearing long sleeves, pants, a wide-brimmed hat, and sunglasses whenever you are outside. HEART DISEASE, DIABETES, AND HIGH BLOOD PRESSURE   High blood pressure causes heart disease and increases the risk of stroke. High blood pressure is more likely to develop in:  People who have blood pressure in the high end   of the normal range (130-139/85-89 mm Hg).  People who are overweight or obese.  People who are African American.  If you are 38-23 years of age, have your blood pressure checked every 3-5 years. If you are 61 years of age or older, have your blood pressure checked every year. You should have your blood pressure measured twice--once when you are at a hospital or clinic, and once when you are not at a hospital or clinic. Record the average of the two measurements. To check your blood pressure when you are not at a hospital or clinic, you can use:  An automated blood pressure machine at a pharmacy.  A home blood pressure monitor.  If you are between 45 years and 39 years old, ask your health care provider if you should take aspirin to prevent strokes.  Have regular diabetes screenings. This involves taking a blood sample to check your fasting blood sugar level.  If you are at a normal weight and have a low risk for diabetes,  have this test once every three years after 36 years of age.  If you are overweight and have a high risk for diabetes, consider being tested at a younger age or more often. PREVENTING INFECTION  Hepatitis B  If you have a higher risk for hepatitis B, you should be screened for this virus. You are considered at high risk for hepatitis B if:  You were born in a country where hepatitis B is common. Ask your health care provider which countries are considered high risk.  Your parents were born in a high-risk country, and you have not been immunized against hepatitis B (hepatitis B vaccine).  You have HIV or AIDS.  You use needles to inject street drugs.  You live with someone who has hepatitis B.  You have had sex with someone who has hepatitis B.  You get hemodialysis treatment.  You take certain medicines for conditions, including cancer, organ transplantation, and autoimmune conditions. Hepatitis C  Blood testing is recommended for:  Everyone born from [birth year] through 1966.  Anyone with known risk factors for hepatitis C. Sexually transmitted infections (STIs)  You should be screened for sexually transmitted infections (STIs) including gonorrhea and chlamydia if:  You are sexually active and are younger than 36 years of age.  You are older than 36 years of age and your health care provider tells you that you are at risk for this type of infection.  Your sexual activity has changed since you were last screened and you are at an increased risk for chlamydia or gonorrhea. Ask your health care provider if you are at risk.  If you do not have HIV, but are at risk, it may be recommended that you take a prescription medicine daily to prevent HIV infection. This is called pre-exposure prophylaxis (PrEP). You are considered at risk if:  You are sexually active and do not regularly use condoms or know the HIV status of your partner(s).  You take drugs by injection.  You are sexually  active with a partner who has HIV. Talk with your health care provider about whether you are at high risk of being infected with HIV. If you choose to begin PrEP, you should first be tested for HIV. You should then be tested every 3 months for as long as you are taking PrEP.  PREGNANCY   If you are premenopausal and you may become pregnant, ask your health care provider about preconception counseling.  If you may  become pregnant, take 400 to 800 micrograms (mcg) of folic acid every day.  If you want to prevent pregnancy, talk to your health care provider about birth control (contraception). OSTEOPOROSIS AND MENOPAUSE   Osteoporosis is a disease in which the bones lose minerals and strength with aging. This can result in serious bone fractures. Your risk for osteoporosis can be identified using a bone density scan.  If you are 61 years of age or older, or if you are at risk for osteoporosis and fractures, ask your health care provider if you should be screened.  Ask your health care provider whether you should take a calcium or vitamin D supplement to lower your risk for osteoporosis.  Menopause may have certain physical symptoms and risks.  Hormone replacement therapy may reduce some of these symptoms and risks. Talk to your health care provider about whether hormone replacement therapy is right for you.  HOME CARE INSTRUCTIONS   Schedule regular health, dental, and eye exams.  Stay current with your immunizations.   Do not use any tobacco products including cigarettes, chewing tobacco, or electronic cigarettes.  If you are pregnant, do not drink alcohol.  If you are breastfeeding, limit how much and how often you drink alcohol.  Limit alcohol intake to no more than 1 drink per day for nonpregnant women. One drink equals 12 ounces of beer, 5 ounces of wine, or 1 ounces of hard liquor.  Do not use street drugs.  Do not share needles.  Ask your health care provider for help if  you need support or information about quitting drugs.  Tell your health care provider if you often feel depressed.  Tell your health care provider if you have ever been abused or do not feel safe at home.   This information is not intended to replace advice given to you by your health care provider. Make sure you discuss any questions you have with your health care provider.   Document Released: 09/01/2010 Document Revised: 03/09/2014 Document Reviewed: 01/18/2013 Elsevier Interactive Patient Education [employer].

## 2015-11-20 NOTE — Addendum Note (Signed)
Addended by: Kem ParkinsonBARNES, Taji Barretto on: 11/20/2015 04:33 PM   Modules accepted: Orders

## 2015-11-20 NOTE — Progress Notes (Signed)
Renee Clark 27-Apr-1980 [id number]    History:    Presents for annual exam.  Monthly 5-7 day heavy cycle ParaGard IUD placed 2012. History of recurrent BV, no problems currently. Normal Pap history. HSV rare outbreaks.   Past medical history, past surgical history, family history and social history were all reviewed and documented in the EPIC chart. 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 Mother hypertension.  ROS:  A ROS was performed and pertinent positives and negatives are included.  Exam:  Vitals:   11/20/15 1549  BP: 128/80  Weight: 180 lb (81.6 kg)  Height: 5\' 9"  (1.753 m)   Body mass index is 26.58 kg/m.   General appearance:  Normal Thyroid:  Symmetrical, normal in size, without palpable masses or nodularity. Respiratory  Auscultation:  Clear without wheezing or rhonchi Cardiovascular  Auscultation:  Regular rate, without rubs, murmurs or gallops  Edema/varicosities:  Not grossly evident Abdominal  Soft,nontender, without masses, guarding or rebound.  Liver/spleen:  No organomegaly noted  Hernia:  None appreciated  Skin  Inspection:  Grossly normal   Breasts: Examined lying and sitting.     Right: 1 cm mobile superficial nodule at 2:00 position nontender,no retractions, discharge or axillary adenopathy.     Left: Without masses, retractions, discharge or axillary adenopathy. Gentitourinary   Inguinal/mons:  Normal without inguinal adenopathy  External genitalia:  Normal  BUS/Urethra/Skene's glands:  Normal  Vagina:  Normal  Cervix:  Normal IUD strings visible  Uterus:   normal in size, shape and contour.  Midline and mobile  Adnexa/parametria:     Rt: Without masses or tenderness.   Lt: Without masses or tenderness.  Anus and perineum: Normal  Digital rectal exam: Normal sphincter tone without palpated masses or tenderness  Assessment/Plan:  34 y.o.DBF G3P3  for annual exam reports right breast nodule  has increased in size over the past  few months.  Right breast 2:00 position superficial 1 cm firm, smooth nodule 2012 ParaGard IUD STD screen HSV-rare outbreaks  Plan: Will schedule diagnostic mammogram and ultrasound of right breast. Reviewed appears to be superficial. Continue SBE's, exercise, calcium rich diet, vitamin D 1000 daily encouraged. Continue condoms until permanent partner. Valtrex 500 twice daily for 3-5 days when necessary prescription, proper use given and reviewed. CBC, CMP, UA, Pap with HR HPV typing, new screening guidelines reviewed, GC/Chlamydia, HIV, hep B, C, RPR.     Harrington ChallengerYOUNG,Renee Clark WHNP, 4:16 PM 11/20/2015

## 2015-11-21 ENCOUNTER — Telehealth: Payer: Self-pay | Admitting: *Deleted

## 2015-11-21 DIAGNOSIS — N63 Unspecified lump in unspecified breast: Secondary | ICD-10-CM

## 2015-11-21 DIAGNOSIS — Z803 Family history of malignant neoplasm of breast: Secondary | ICD-10-CM

## 2015-11-21 LAB — URINALYSIS W MICROSCOPIC + REFLEX CULTURE
BACTERIA UA: NONE SEEN [HPF]
BILIRUBIN URINE: NEGATIVE
CRYSTALS: NONE SEEN [HPF]
Casts: NONE SEEN [LPF]
GLUCOSE, UA: NEGATIVE
HGB URINE DIPSTICK: NEGATIVE
Ketones, ur: NEGATIVE
LEUKOCYTES UA: NEGATIVE
Nitrite: NEGATIVE
Protein, ur: NEGATIVE
RBC / HPF: NONE SEEN RBC/HPF (ref <=2)
SQUAMOUS EPITHELIAL / LPF: NONE SEEN [HPF] (ref <=5)
Specific Gravity, Urine: 1.027 (ref 1.001–1.035)
WBC UA: NONE SEEN WBC/HPF (ref <=5)
Yeast: NONE SEEN [HPF]
pH: 6.5 (ref 5.0–8.0)

## 2015-11-21 LAB — GC/CHLAMYDIA PROBE AMP
CT Probe RNA: NOT DETECTED
GC PROBE AMP APTIMA: NOT DETECTED

## 2015-11-21 LAB — PAP, TP IMAGING W/ HPV RNA, RFLX HPV TYPE 16,18/45: HPV MRNA, HIGH RISK: NOT DETECTED

## 2015-11-21 NOTE — Telephone Encounter (Signed)
Appointment on 11/25/15 @ 2:10pm at [facility], pt aware of time and date.

## 2015-11-21 NOTE — Telephone Encounter (Signed)
-----   Message from Harrington ChallengerNancy J Young, NP sent at 11/20/2015  4:47 PM EDT ----- Please schedule right breast diagnostic mammogram and ultrasound. 1 cm mobile nontender superficial nodule at 2:00 position. Patient reports that it has gotten larger recently. Afternoons  best. Maternal aunt breast cancer history/Survivor

## 2015-11-25 ENCOUNTER — Ambulatory Visit
Admission: RE | Admit: 2015-11-25 | Discharge: 2015-11-25 | Disposition: A | Discharge status: Home / Self Care | Payer: Commercial Managed Care - PPO | Source: Ambulatory Visit | Attending: Women's Health | Admitting: Women's Health

## 2015-11-25 DIAGNOSIS — N63 Unspecified lump in unspecified breast: Secondary | ICD-10-CM

## 2015-11-25 DIAGNOSIS — Z803 Family history of malignant neoplasm of breast: Secondary | ICD-10-CM

## 2015-12-02 ENCOUNTER — Other Ambulatory Visit: Payer: Commercial Managed Care - PPO

## 2015-12-02 LAB — CBC WITH DIFFERENTIAL/PLATELET
BASOS ABS: 41 {cells}/uL (ref 0–200)
BASOS PCT: 1 %
EOS ABS: 41 {cells}/uL (ref 15–500)
Eosinophils Relative: 1 %
HEMATOCRIT: 33.6 % — AB (ref 35.0–45.0)
Hemoglobin: 10.9 g/dL — ABNORMAL LOW (ref 11.7–15.5)
LYMPHS PCT: 41 %
Lymphs Abs: 1681 cells/uL (ref 850–3900)
MCH: 27.4 pg (ref 27.0–33.0)
MCHC: 32.4 g/dL (ref 32.0–36.0)
MCV: 84.4 fL (ref 80.0–100.0)
MONO ABS: 492 {cells}/uL (ref 200–950)
MONOS PCT: 12 %
MPV: 9.8 fL (ref 7.5–12.5)
NEUTROS PCT: 45 %
Neutro Abs: 1845 cells/uL (ref 1500–7800)
PLATELETS: 304 10*3/uL (ref 140–400)
RBC: 3.98 MIL/uL (ref 3.80–5.10)
RDW: 14.8 % (ref 11.0–15.0)
WBC: 4.1 10*3/uL (ref 3.8–10.8)

## 2015-12-02 LAB — COMPREHENSIVE METABOLIC PANEL
ALK PHOS: 52 U/L (ref 33–115)
ALT: 4 U/L — AB (ref 6–29)
AST: 14 U/L (ref 10–30)
Albumin: 3.8 g/dL (ref 3.6–5.1)
BUN: 13 mg/dL (ref 7–25)
CALCIUM: 8.9 mg/dL (ref 8.6–10.2)
CO2: 24 mmol/L (ref 20–31)
Chloride: 104 mmol/L (ref 98–110)
Creat: 0.69 mg/dL (ref 0.50–1.10)
Glucose, Bld: 86 mg/dL (ref 65–99)
POTASSIUM: 3.7 mmol/L (ref 3.5–5.3)
Sodium: 136 mmol/L (ref 135–146)
Total Bilirubin: 0.3 mg/dL (ref 0.2–1.2)
Total Protein: 6.8 g/dL (ref 6.1–8.1)

## 2015-12-03 LAB — RPR

## 2015-12-03 LAB — HEPATITIS B SURFACE ANTIGEN: Hepatitis B Surface Ag: NEGATIVE

## 2015-12-03 LAB — HEPATITIS C ANTIBODY: HCV AB: NEGATIVE

## 2015-12-03 LAB — HIV ANTIBODY (ROUTINE TESTING W REFLEX): HIV: NONREACTIVE

## 2017-01-05 ENCOUNTER — Encounter: Payer: Self-pay | Admitting: Women's Health

## 2017-01-05 ENCOUNTER — Ambulatory Visit (INDEPENDENT_AMBULATORY_CARE_PROVIDER_SITE_OTHER): Payer: 59 | Admitting: Women's Health

## 2017-01-05 VITALS — BP 126/80 | Ht 69.0 in | Wt 193.0 lb

## 2017-01-05 DIAGNOSIS — N760 Acute vaginitis: Secondary | ICD-10-CM | POA: Diagnosis not present

## 2017-01-05 DIAGNOSIS — Z13220 Encounter for screening for lipoid disorders: Secondary | ICD-10-CM

## 2017-01-05 DIAGNOSIS — N946 Dysmenorrhea, unspecified: Secondary | ICD-10-CM

## 2017-01-05 DIAGNOSIS — Z113 Encounter for screening for infections with a predominantly sexual mode of transmission: Secondary | ICD-10-CM

## 2017-01-05 DIAGNOSIS — Z01419 Encounter for gynecological examination (general) (routine) without abnormal findings: Secondary | ICD-10-CM | POA: Diagnosis not present

## 2017-01-05 DIAGNOSIS — B9689 Other specified bacterial agents as the cause of diseases classified elsewhere: Secondary | ICD-10-CM | POA: Diagnosis not present

## 2017-01-05 DIAGNOSIS — B009 Herpesviral infection, unspecified: Secondary | ICD-10-CM | POA: Diagnosis not present

## 2017-01-05 LAB — C. TRACHOMATIS/N. GONORRHOEAE RNA
C. trachomatis RNA, TMA: NOT DETECTED
N. gonorrhoeae RNA, TMA: NOT DETECTED

## 2017-01-05 MED ORDER — METRONIDAZOLE 0.75 % VA GEL: VAGINAL | 0 refills | Status: DC

## 2017-01-05 MED ORDER — IBUPROFEN 600 MG PO TABS: 600.0000 mg | ORAL_TABLET | Freq: Three times a day (TID) | ORAL | 1 refills | Status: DC | PRN

## 2017-01-05 MED ORDER — VALACYCLOVIR HCL 500 MG PO TABS: ORAL_TABLET | ORAL | 12 refills | Status: DC

## 2017-01-05 NOTE — Patient Instructions (Signed)

## 2017-01-05 NOTE — Progress Notes (Signed)
Renee Clark Renee Clark Jun 12, 1980 [id number]    History:    Presents for annual exam.  Monthly 7 day cycle, 4 days heavy. 2012 ParaGard IUD. Normal Pap history. History of recurrent BV minimal problems this past year.  Past medical history, past surgical history, family history and social history were all reviewed and documented in the EPIC chart. Works from home. 3 children daughter 5313, son 6 and 8 all doing well. New partner.  ROS:  A ROS was performed and pertinent positives and negatives are included.  Exam:  Vitals:   01/05/17 0917  BP: 126/80  Weight: 193 lb (87.5 kg)  Height: 5\' 9"  (1.753 m)   Body mass index is 28.5 kg/m.   General appearance:  Normal Thyroid:  Symmetrical, normal in size, without palpable masses or nodularity. Respiratory  Auscultation:  Clear without wheezing or rhonchi Cardiovascular  Auscultation:  Regular rate, without rubs, murmurs or gallops  Edema/varicosities:  Not grossly evident Abdominal  Soft,nontender, without masses, guarding or rebound.  Liver/spleen:  No organomegaly noted  Hernia:  None appreciated  Skin  Inspection:  Grossly normal   Breasts: Examined lying and sitting.     Right: Without masses, retractions, discharge or axillary adenopathy.     Left: Without masses, retractions, discharge or axillary adenopathy. Gentitourinary   Inguinal/mons:  Normal without inguinal adenopathy  External genitalia:  Normal  BUS/Urethra/Skene's glands:  Normal  Vagina:  Normal  Cervix:  Normal IUD strings visible  Uterus:  normal in size, shape and contour.  Midline and mobile  Adnexa/parametria:     Rt: Without masses or tenderness.   Lt: Without masses or tenderness.  Anus and perineum: Normal  Digital rectal exam: Normal sphincter tone without palpated masses or tenderness  Assessment/Plan:  36 y.o. SBF G5 P3 for annual exam with no complaints.  2012 ParaGard IUD with monthly cycle History of recurrent BV HSV rare outbreaks STD  screen  Plan: Refill of MetroGel vaginal cream given to use when necessary, states has not needed in the past year. Valtrex 500 twice daily for 3-5 days as needed. Prescription, proper use given and reviewed. SBE's, exercise, calcium rich diet, vitamin D 1000 daily and condoms until permanent partner encouraged. Motrin 600 mg with cycles during heavy days encouraged. Instructed to call cycles last greater than 7 days. Other options of Mirena IUD reviewed, declines. Pap normal 2017, new screening guidelines reviewed. CBC, glucose, lipid panel, HIV, hep B, C, RPR, GC/Chlamydia pending.    Harrington Challengerancy J Young [facility] HospitalWHNP, 10:34 AM 01/05/2017

## 2017-01-06 LAB — LIPID PANEL
CHOL/HDL RATIO: 3.8 (calc) (ref <5.0)
CHOLESTEROL: 192 mg/dL (ref <200)
HDL: 50 mg/dL — AB (ref >50)
LDL Cholesterol (Calc): 123 mg/dL (calc) — ABNORMAL HIGH
Non-HDL Cholesterol (Calc): 142 mg/dL (calc) — ABNORMAL HIGH (ref <130)
Triglycerides: 87 mg/dL (ref <150)

## 2017-01-06 LAB — CBC WITH DIFFERENTIAL/PLATELET
BASOS PCT: 0.8 %
Basophils Absolute: 30 cells/uL (ref 0–200)
EOS ABS: 42 {cells}/uL (ref 15–500)
Eosinophils Relative: 1.1 %
HCT: 36.1 % (ref 35.0–45.0)
Hemoglobin: 12 g/dL (ref 11.7–15.5)
Lymphs Abs: 1391 cells/uL (ref 850–3900)
MCH: 29.3 pg (ref 27.0–33.0)
MCHC: 33.2 g/dL (ref 32.0–36.0)
MCV: 88 fL (ref 80.0–100.0)
MPV: 10.1 fL (ref 7.5–12.5)
Monocytes Relative: 8.2 %
NEUTROS PCT: 53.3 %
Neutro Abs: 2025 cells/uL (ref 1500–7800)
PLATELETS: 339 10*3/uL (ref 140–400)
RBC: 4.1 10*6/uL (ref 3.80–5.10)
RDW: 13.3 % (ref 11.0–15.0)
TOTAL LYMPHOCYTE: 36.6 %
WBC: 3.8 10*3/uL (ref 3.8–10.8)
WBCMIX: 312 {cells}/uL (ref 200–950)

## 2017-01-06 LAB — URINALYSIS W MICROSCOPIC + REFLEX CULTURE
BACTERIA UA: NONE SEEN /HPF
Bilirubin Urine: NEGATIVE
Glucose, UA: NEGATIVE
HGB URINE DIPSTICK: NEGATIVE
HYALINE CAST: NONE SEEN /LPF
Leukocyte Esterase: NEGATIVE
Nitrites, Initial: NEGATIVE
PH: 5.5 (ref 5.0–8.0)
PROTEIN: NEGATIVE
SQUAMOUS EPITHELIAL / LPF: NONE SEEN /HPF (ref <=5)
Specific Gravity, Urine: 1.025 (ref 1.001–1.03)
WBC UA: NONE SEEN /HPF (ref 0–5)

## 2017-01-06 LAB — HIV ANTIBODY (ROUTINE TESTING W REFLEX): HIV: NONREACTIVE

## 2017-01-06 LAB — HEPATITIS C ANTIBODY
HEP C AB: NONREACTIVE
SIGNAL TO CUT-OFF: 0.01 (ref <1.00)

## 2017-01-06 LAB — NO CULTURE INDICATED

## 2017-01-06 LAB — RPR: RPR: NONREACTIVE

## 2017-01-06 LAB — GLUCOSE, RANDOM: GLUCOSE: 68 mg/dL (ref 65–99)

## 2017-01-06 LAB — HEPATITIS B SURFACE ANTIGEN: Hepatitis B Surface Ag: NONREACTIVE

## 2017-04-09 ENCOUNTER — Encounter: Payer: Self-pay | Admitting: Obstetrics & Gynecology

## 2017-04-09 ENCOUNTER — Ambulatory Visit: Payer: 59 | Admitting: Obstetrics & Gynecology

## 2017-04-09 VITALS — BP 122/80

## 2017-04-09 DIAGNOSIS — Z113 Encounter for screening for infections with a predominantly sexual mode of transmission: Secondary | ICD-10-CM

## 2017-04-09 DIAGNOSIS — Z30431 Encounter for routine checking of intrauterine contraceptive device: Secondary | ICD-10-CM | POA: Diagnosis not present

## 2017-04-09 DIAGNOSIS — R102 Pelvic and perineal pain: Secondary | ICD-10-CM

## 2017-04-09 NOTE — Progress Notes (Signed)
    Renee Clark 1980-06-12 [id number]        37 y.o.  [id number] Single  RP: Pelvic cramps/IUD strings not felt after LMP  HPI:  LMP 03/21/2017 same heavy flow which lasted 6 days.  Checks the IUD strings after each period, this time could not feel them.  No BTB.  C/O occasional cramps since LMP.  No abnormal vaginal discharge.  No itching, no odor. No fever.  Using condoms, had 1 condom break in 03/2017.  No UTI Sx.   OB History  Gravida Para Term Preterm AB Living  5       2 3   SAB TAB Ectopic Multiple Live Births               # Outcome Date GA Lbr Len/2nd Weight Sex Delivery Anes PTL Lv  5 AB           4 AB           3 Gravida           2 Gravida           1 Gravida               Past medical history,surgical history, problem list, medications, allergies, family history and social history were all reviewed and documented in the EPIC chart.   Directed ROS with pertinent positives and negatives documented in the history of present illness/assessment and plan.  Exam:  Vitals:   04/09/17 1149  BP: 122/80   General appearance:  Normal  Abdomen: Normal  Gynecologic exam: Vulva normal.  Speculum exam: Cervix normal with IUD strings visible in normal position.  Vagina normal.  Normal vaginal secretions.  Gonorrhea and chlamydia done.   Assessment/Plan:  37 y.o. [id number]   1. IUD check up IUD in good position, no sign of infection.  Patient reassured.  2. Pelvic cramping Patient reassured by good placement of her IUD.  Will use ibuprofen as needed.  3. Screen for STD (sexually transmitted disease) Rule out STD, gonorrhea and chlamydia pending.  Counseling on above issues more than 50% for 15 minutes.  Genia DelMarie-Lyne Arvel Oquinn MD, 11:50 AM 04/09/2017

## 2017-04-10 LAB — C. TRACHOMATIS/N. GONORRHOEAE RNA
C. TRACHOMATIS RNA, TMA: NOT DETECTED
N. GONORRHOEAE RNA, TMA: NOT DETECTED

## 2017-04-12 ENCOUNTER — Encounter: Payer: Self-pay | Admitting: Obstetrics & Gynecology

## 2017-04-12 NOTE — Patient Instructions (Addendum)
1. IUD check up IUD in good position, no sign of infection.  Patient reassured.  2. Pelvic cramping Patient reassured by good placement of her IUD.  Will use ibuprofen as needed.  3. Screen for STD (sexually transmitted disease) Rule out STD, gonorrhea and chlamydia pending.  [name], it was a pleasure meeting you today!

## 2017-06-29 DIAGNOSIS — M545 Low back pain: Secondary | ICD-10-CM | POA: Diagnosis not present

## 2017-06-29 DIAGNOSIS — M25551 Pain in right hip: Secondary | ICD-10-CM | POA: Diagnosis not present

## 2017-07-07 ENCOUNTER — Encounter: Payer: Self-pay | Admitting: Women's Health

## 2017-07-07 ENCOUNTER — Ambulatory Visit: Payer: 59 | Admitting: Women's Health

## 2017-07-07 VITALS — BP 128/80

## 2017-07-07 DIAGNOSIS — Z30015 Encounter for initial prescription of vaginal ring hormonal contraceptive: Secondary | ICD-10-CM | POA: Diagnosis not present

## 2017-07-07 DIAGNOSIS — Z30432 Encounter for removal of intrauterine contraceptive device: Secondary | ICD-10-CM | POA: Diagnosis not present

## 2017-07-07 MED ORDER — ETONOGESTREL-ETHINYL ESTRADIOL 0.12-0.015 MG/24HR VA RING
VAGINAL_RING | VAGINAL | 3 refills | Status: DC
Start: 2017-07-07 — End: 2018-01-11

## 2017-07-07 NOTE — Patient Instructions (Signed)
Ethinyl Estradiol; Etonogestrel vaginal ring What is this medicine? ETHINYL ESTRADIOL; ETONOGESTREL (ETH in il es tra DYE ole; et oh noe JES trel) vaginal ring is a flexible, vaginal ring used as a contraceptive (birth control method). This medicine combines two types of female hormones, an estrogen and a progestin. This ring is used to prevent ovulation and pregnancy. Each ring is effective for one month. This medicine may be used for other purposes; ask your health care provider or pharmacist if you have questions. COMMON BRAND NAME(S): NuvaRing What should I tell my health care provider before I take this medicine? They need to know if you have or ever had any of these conditions: -abnormal vaginal bleeding -blood vessel disease or blood clots -breast, cervical, endometrial, ovarian, liver, or uterine cancer -diabetes -gallbladder disease -heart disease or recent heart attack -high blood pressure -high cholesterol -kidney disease -liver disease -migraine headaches -stroke -systemic lupus erythematosus (SLE) -tobacco smoker -an unusual or allergic reaction to estrogens, progestins, other medicines, foods, dyes, or preservatives -pregnant or trying to get pregnant -breast-feeding How should I use this medicine? Insert the ring into your vagina as directed. Follow the directions on the prescription label. The ring will remain place for 3 weeks and is then removed for a 1-week break. A new ring is inserted 1 week after the last ring was removed, on the same day of the week. Check often to make sure the ring is still in place, especially before and after sexual intercourse. If the ring was out of the vagina for an unknown amount of time, you may not be protected from pregnancy. Perform a pregnancy test and call your doctor. Do not use more often than directed. A patient package insert for the product will be given with each prescription and refill. Read this sheet carefully each time. The  sheet may change frequently. Contact your pediatrician regarding the use of this medicine in children. Special care may be needed. This medicine has been used in female children who have started having menstrual periods. Overdosage: If you think you have taken too much of this medicine contact a poison control center or emergency room at once. NOTE: This medicine is only for you. Do not share this medicine with others. What if I miss a dose? You will need to replace your vaginal ring once a month as directed. If the ring should slip out, or if you leave it in longer or shorter than you should, contact your health care professional for advice. What may interact with this medicine? Do not take this medicine with the following medication: -dasabuvir; ombitasvir; paritaprevir; ritonavir -ombitasvir; paritaprevir; ritonavir This medicine may also interact with the following medications: -acetaminophen -antibiotics or medicines for infections, especially rifampin, rifabutin, rifapentine, and griseofulvin, and possibly penicillins or tetracyclines -aprepitant -ascorbic acid (vitamin C) -atorvastatin -barbiturate medicines, such as phenobarbital -bosentan -carbamazepine -caffeine -clofibrate -cyclosporine -dantrolene -doxercalciferol -felbamate -grapefruit juice -hydrocortisone -medicines for anxiety or sleeping problems, such as diazepam or temazepam -medicines for diabetes, including pioglitazone -modafinil -mycophenolate -nefazodone -oxcarbazepine -phenytoin -prednisolone -ritonavir or other medicines for HIV infection or AIDS -rosuvastatin -selegiline -soy isoflavones supplements -St. John's wort -tamoxifen or raloxifene -theophylline -thyroid hormones -topiramate -warfarin This list may not describe all possible interactions. Give your health care provider a list of all the medicines, herbs, non-prescription drugs, or dietary supplements you use. Also tell them if you smoke,  drink alcohol, or use illegal drugs. Some items may interact with your medicine. What should I watch for while using   this medicine? Visit your doctor or health care professional for regular checks on your progress. You will need a regular breast and pelvic exam and Pap smear while on this medicine. Use an additional method of contraception during the first cycle that you use this ring. Do not use a diaphragm or female condom, as the ring can interfere with these birth control methods and their proper placement. If you have any reason to think you are pregnant, stop using this medicine right away and contact your doctor or health care professional. If you are using this medicine for hormone related problems, it may take several cycles of use to see improvement in your condition. Smoking increases the risk of getting a blood clot or having a stroke while you are using hormonal birth control, especially if you are more than 38 years old. You are strongly advised not to smoke. This medicine can make your body retain fluid, making your fingers, hands, or ankles swell. Your blood pressure can go up. Contact your doctor or health care professional if you feel you are retaining fluid. This medicine can make you more sensitive to the sun. Keep out of the sun. If you cannot avoid being in the sun, wear protective clothing and use sunscreen. Do not use sun lamps or tanning beds/booths. If you wear contact lenses and notice visual changes, or if the lenses begin to feel uncomfortable, consult your eye care specialist. In some women, tenderness, swelling, or minor bleeding of the gums may occur. Notify your dentist if this happens. Brushing and flossing your teeth regularly may help limit this. See your dentist regularly and inform your dentist of the medicines you are taking. If you are going to have elective surgery, you may need to stop using this medicine before the surgery. Consult your health care professional  for advice. This medicine does not protect you against HIV infection (AIDS) or any other sexually transmitted diseases. What side effects may I notice from receiving this medicine? Side effects that you should report to your doctor or health care professional as soon as possible: -breast tissue changes or discharge -changes in vaginal bleeding during your period or between your periods -chest pain -coughing up blood -dizziness or fainting spells -headaches or migraines -leg, arm or groin pain -severe or sudden headaches -stomach pain (severe) -sudden shortness of breath -sudden loss of coordination, especially on one side of the body -speech problems -symptoms of vaginal infection like itching, irritation or unusual discharge -tenderness in the upper abdomen -vomiting -weakness or numbness in the arms or legs, especially on one side of the body -yellowing of the eyes or skin Side effects that usually do not require medical attention (report to your doctor or health care professional if they continue or are bothersome): -breakthrough bleeding and spotting that continues beyond the 3 initial cycles of pills -breast tenderness -mood changes, anxiety, depression, frustration, anger, or emotional outbursts -increased sensitivity to sun or ultraviolet light -nausea -skin rash, acne, or brown spots on the skin -weight gain (slight) This list may not describe all possible side effects. Call your doctor for medical advice about side effects. You may report side effects to FDA at 1-800-FDA-1088. Where should I keep my medicine? Keep out of the reach of children. Store at room temperature between 15 and 30 degrees C (59 and 86 degrees F) for up to 4 months. The product will expire after 4 months. Protect from light. Throw away any unused medicine after the expiration date. NOTE: This   sheet is a summary. It may not cover all possible information. If you have questions about this medicine, talk  to your doctor, pharmacist, or health care provider.  2018 Elsevier/Gold Standard (2015-10-25 17:00:31)  

## 2017-07-07 NOTE — Progress Notes (Signed)
37 year old SBF G P3 presents with complaint of changes with menstrual cycles,  lasting 8 to 9 days,  day 3-6 are heavy changing every 2-3 hours, increased menstrual cramping.  Denies breakthrough bleeding.  Reports change over the last 2-3 cycles, reports that she felt like IUD may have been moved / wrong place.  Was seen in the office 04/2017 and reassured IUD strings visible.  Would like to have IUD removed and use other type of contraception.  Has not been sexually active for several months, denies need for STD screen (negative screen 04/2017).  Also uses condoms consistently with activity.  ParaGard IUD placed 2012.  History of recurrent BV no problems recently.  Denies UTI symptoms, vaginal discharge, abdominal pain or fever.  Exam: Appears well.  External genitalia within normal limits, speculum exam IUD strings visible, grasped with ring forcep, removed with ease, shown to patient and discarded.  Bimanual no CMT or adnexal tenderness.  Contraception management  Plan: Contraception options reviewed , used  nuva ring in the past would like to try again, prescription, proper use, slight risk for blood clots and strokes  reviewed. Non-smoker.  Instructed to place with next cycle, if cycles continue to last greater than 7 days instructed to call or return for further testing.  OTC Motrin as needed for cramping.

## 2017-07-14 DIAGNOSIS — M25551 Pain in right hip: Secondary | ICD-10-CM | POA: Diagnosis not present

## 2017-07-20 DIAGNOSIS — M7061 Trochanteric bursitis, right hip: Secondary | ICD-10-CM | POA: Diagnosis not present

## 2017-07-20 DIAGNOSIS — M25551 Pain in right hip: Secondary | ICD-10-CM | POA: Diagnosis not present

## 2017-07-28 DIAGNOSIS — M7601 Gluteal tendinitis, right hip: Secondary | ICD-10-CM | POA: Diagnosis not present

## 2017-07-28 DIAGNOSIS — M7061 Trochanteric bursitis, right hip: Secondary | ICD-10-CM | POA: Diagnosis not present

## 2017-07-28 DIAGNOSIS — M25551 Pain in right hip: Secondary | ICD-10-CM | POA: Diagnosis not present

## 2017-07-29 DIAGNOSIS — M7601 Gluteal tendinitis, right hip: Secondary | ICD-10-CM | POA: Diagnosis not present

## 2017-07-29 DIAGNOSIS — M7061 Trochanteric bursitis, right hip: Secondary | ICD-10-CM | POA: Diagnosis not present

## 2017-07-29 DIAGNOSIS — M25551 Pain in right hip: Secondary | ICD-10-CM | POA: Diagnosis not present

## 2017-08-03 DIAGNOSIS — M7061 Trochanteric bursitis, right hip: Secondary | ICD-10-CM | POA: Diagnosis not present

## 2017-08-03 DIAGNOSIS — M25551 Pain in right hip: Secondary | ICD-10-CM | POA: Diagnosis not present

## 2017-08-03 DIAGNOSIS — M7601 Gluteal tendinitis, right hip: Secondary | ICD-10-CM | POA: Diagnosis not present

## 2017-08-05 DIAGNOSIS — M25551 Pain in right hip: Secondary | ICD-10-CM | POA: Diagnosis not present

## 2017-08-05 DIAGNOSIS — M7061 Trochanteric bursitis, right hip: Secondary | ICD-10-CM | POA: Diagnosis not present

## 2017-08-05 DIAGNOSIS — M7601 Gluteal tendinitis, right hip: Secondary | ICD-10-CM | POA: Diagnosis not present

## 2017-08-09 DIAGNOSIS — M25551 Pain in right hip: Secondary | ICD-10-CM | POA: Diagnosis not present

## 2017-08-09 DIAGNOSIS — M7601 Gluteal tendinitis, right hip: Secondary | ICD-10-CM | POA: Diagnosis not present

## 2017-08-09 DIAGNOSIS — M7061 Trochanteric bursitis, right hip: Secondary | ICD-10-CM | POA: Diagnosis not present

## 2017-08-12 DIAGNOSIS — M7601 Gluteal tendinitis, right hip: Secondary | ICD-10-CM | POA: Diagnosis not present

## 2017-08-12 DIAGNOSIS — M25551 Pain in right hip: Secondary | ICD-10-CM | POA: Diagnosis not present

## 2017-08-12 DIAGNOSIS — M7061 Trochanteric bursitis, right hip: Secondary | ICD-10-CM | POA: Diagnosis not present

## 2017-08-17 DIAGNOSIS — M25551 Pain in right hip: Secondary | ICD-10-CM | POA: Diagnosis not present

## 2017-08-17 DIAGNOSIS — M7061 Trochanteric bursitis, right hip: Secondary | ICD-10-CM | POA: Diagnosis not present

## 2017-08-17 DIAGNOSIS — M7601 Gluteal tendinitis, right hip: Secondary | ICD-10-CM | POA: Diagnosis not present

## 2017-08-19 DIAGNOSIS — M25551 Pain in right hip: Secondary | ICD-10-CM | POA: Diagnosis not present

## 2017-08-19 DIAGNOSIS — M7061 Trochanteric bursitis, right hip: Secondary | ICD-10-CM | POA: Diagnosis not present

## 2017-08-19 DIAGNOSIS — M7601 Gluteal tendinitis, right hip: Secondary | ICD-10-CM | POA: Diagnosis not present

## 2017-09-03 DIAGNOSIS — M7061 Trochanteric bursitis, right hip: Secondary | ICD-10-CM | POA: Diagnosis not present

## 2017-09-03 DIAGNOSIS — M7601 Gluteal tendinitis, right hip: Secondary | ICD-10-CM | POA: Diagnosis not present

## 2017-09-03 DIAGNOSIS — M25551 Pain in right hip: Secondary | ICD-10-CM | POA: Diagnosis not present

## 2018-01-11 ENCOUNTER — Ambulatory Visit: Payer: 59 | Admitting: Women's Health

## 2018-01-11 ENCOUNTER — Encounter: Payer: Self-pay | Admitting: Women's Health

## 2018-01-11 VITALS — BP 124/80 | Ht 69.0 in | Wt 203.0 lb

## 2018-01-11 DIAGNOSIS — Z01419 Encounter for gynecological examination (general) (routine) without abnormal findings: Secondary | ICD-10-CM | POA: Diagnosis not present

## 2018-01-11 DIAGNOSIS — Z13220 Encounter for screening for lipoid disorders: Secondary | ICD-10-CM

## 2018-01-11 DIAGNOSIS — Z113 Encounter for screening for infections with a predominantly sexual mode of transmission: Secondary | ICD-10-CM | POA: Diagnosis not present

## 2018-01-11 NOTE — Patient Instructions (Signed)

## 2018-01-11 NOTE — Addendum Note (Signed)
Addended by: Tito Dine on: 01/11/2018 12:48 PM   Modules accepted: Orders

## 2018-01-11 NOTE — Progress Notes (Signed)
Renee Clark 23-Oct-1980 [id number]    History:    Presents for annual exam.  06/2017 ParaGard IUD removed, monthly cycles since/condoms.   Normal Pap history.  HSV rare outbreaks.  History of recurrent BV and recent..  Past medical history, past surgical history, family history and social history were all reviewed and documented in the EPIC chart.  Works from home.  3 children ages 6214, 479 and 7 all doing well.  Maternal aunt with breast cancer in her 5940s now in her 6570s doing well.  Mother hypertension.  ROS:  A ROS was performed and pertinent positives and negatives are included.  Exam:  Vitals:   01/11/18 1122  BP: 124/80  Weight: 203 lb (92.1 kg)  Height: 5\' 9"  (1.753 m)   Body mass index is 29.98 kg/m.   General appearance:  Normal Thyroid:  Symmetrical, normal in size, without palpable masses or nodularity. Respiratory  Auscultation:  Clear without wheezing or rhonchi Cardiovascular  Auscultation:  Regular rate, without rubs, murmurs or gallops  Edema/varicosities:  Not grossly evident Abdominal  Soft,nontender, without masses, guarding or rebound.  Liver/spleen:  No organomegaly noted  Hernia:  None appreciated  Skin  Inspection:  Grossly normal   Breasts: Examined lying and sitting.     Right: Without masses, retractions, discharge or axillary adenopathy.     Left: Without masses, retractions, discharge or axillary adenopathy. Gentitourinary   Inguinal/mons:  Normal without inguinal adenopathy  External genitalia:  Normal  BUS/Urethra/Skene's glands:  Normal  Vagina:  Normal  Cervix:  Normal  Uterus:  normal in size, shape and contour.  Midline and mobile  Adnexa/parametria:     Rt: Without masses or tenderness.   Lt: Without masses or tenderness.  Anus and perineum: Normal  Digital rectal exam: Normal sphincter tone without palpated masses or tenderness  Assessment/Plan:  37 y.o. DBF G5 P3 for annual exam with no complaints.  Monthly cycle/condoms STD  screen HSV rare outbreaks  Plan: Contraception reviewed, declines will continue with condoms.  SBE's, annual mammogram at 40, calcium rich foods, vitamin D 1000 daily encouraged.  Encouraged to continue regular exercise, decrease calorie/carbs.  CBC, CMP, lipid panel, GC/chlamydia, HIV, hep B, C, RPR.  Normal Pap 2017, new screening guidelines reviewed.   Harrington Challengerancy J Young [facility]NP, 11:42 AM 01/11/2018

## 2018-01-12 LAB — HEPATITIS B SURFACE ANTIGEN: HEP B S AG: NONREACTIVE

## 2018-01-12 LAB — COMPREHENSIVE METABOLIC PANEL
AG Ratio: 1.3 (calc) (ref 1.0–2.5)
ALBUMIN MSPROF: 4 g/dL (ref 3.6–5.1)
ALKALINE PHOSPHATASE (APISO): 65 U/L (ref 33–115)
ALT: 6 U/L (ref 6–29)
AST: 15 U/L (ref 10–30)
BILIRUBIN TOTAL: 0.4 mg/dL (ref 0.2–1.2)
BUN: 8 mg/dL (ref 7–25)
CALCIUM: 9.1 mg/dL (ref 8.6–10.2)
CO2: 25 mmol/L (ref 20–32)
CREATININE: 0.73 mg/dL (ref 0.50–1.10)
Chloride: 106 mmol/L (ref 98–110)
GLOBULIN: 3.1 g/dL (ref 1.9–3.7)
Glucose, Bld: 96 mg/dL (ref 65–99)
Potassium: 4.4 mmol/L (ref 3.5–5.3)
Sodium: 139 mmol/L (ref 135–146)
Total Protein: 7.1 g/dL (ref 6.1–8.1)

## 2018-01-12 LAB — CBC WITH DIFFERENTIAL/PLATELET
BASOS PCT: 0.7 %
Basophils Absolute: 21 cells/uL (ref 0–200)
EOS PCT: 1.4 %
Eosinophils Absolute: 42 cells/uL (ref 15–500)
HCT: 37.7 % (ref 35.0–45.0)
HEMOGLOBIN: 12.6 g/dL (ref 11.7–15.5)
Lymphs Abs: 1419 cells/uL (ref 850–3900)
MCH: 29.9 pg (ref 27.0–33.0)
MCHC: 33.4 g/dL (ref 32.0–36.0)
MCV: 89.5 fL (ref 80.0–100.0)
MONOS PCT: 8.1 %
MPV: 10.5 fL (ref 7.5–12.5)
Neutro Abs: 1275 cells/uL — ABNORMAL LOW (ref 1500–7800)
Neutrophils Relative %: 42.5 %
Platelets: 286 10*3/uL (ref 140–400)
RBC: 4.21 10*6/uL (ref 3.80–5.10)
RDW: 13.3 % (ref 11.0–15.0)
Total Lymphocyte: 47.3 %
WBC mixed population: 243 cells/uL (ref 200–950)
WBC: 3 10*3/uL — AB (ref 3.8–10.8)

## 2018-01-12 LAB — HEPATITIS C ANTIBODY
Hepatitis C Ab: NONREACTIVE
SIGNAL TO CUT-OFF: 0.02 (ref <1.00)

## 2018-01-12 LAB — URINALYSIS, COMPLETE W/RFL CULTURE
BACTERIA UA: NONE SEEN /HPF
Bilirubin Urine: NEGATIVE
Glucose, UA: NEGATIVE
HGB URINE DIPSTICK: NEGATIVE
Hyaline Cast: NONE SEEN /LPF
Ketones, ur: NEGATIVE
Leukocyte Esterase: NEGATIVE
Nitrites, Initial: NEGATIVE
Protein, ur: NEGATIVE
RBC / HPF: NONE SEEN /HPF (ref 0–2)
SQUAMOUS EPITHELIAL / LPF: NONE SEEN /HPF (ref <=5)
Specific Gravity, Urine: 1.011 (ref 1.001–1.03)
WBC UA: NONE SEEN /HPF (ref 0–5)
pH: 7.5 (ref 5.0–8.0)

## 2018-01-12 LAB — C. TRACHOMATIS/N. GONORRHOEAE RNA
C. trachomatis RNA, TMA: NOT DETECTED
N. gonorrhoeae RNA, TMA: NOT DETECTED

## 2018-01-12 LAB — LIPID PANEL
CHOL/HDL RATIO: 4 (calc) (ref <5.0)
CHOLESTEROL: 193 mg/dL (ref <200)
HDL: 48 mg/dL — AB (ref >50)
LDL CHOLESTEROL (CALC): 123 mg/dL — AB
Non-HDL Cholesterol (Calc): 145 mg/dL (calc) — ABNORMAL HIGH (ref <130)
Triglycerides: 117 mg/dL (ref <150)

## 2018-01-12 LAB — HIV ANTIBODY (ROUTINE TESTING W REFLEX): HIV: NONREACTIVE

## 2018-01-12 LAB — NO CULTURE INDICATED

## 2018-01-12 LAB — RPR: RPR Ser Ql: NONREACTIVE

## 2018-02-01 DIAGNOSIS — M25551 Pain in right hip: Secondary | ICD-10-CM | POA: Diagnosis not present

## 2018-02-07 DIAGNOSIS — M25551 Pain in right hip: Secondary | ICD-10-CM | POA: Diagnosis not present

## 2018-02-19 DIAGNOSIS — J101 Influenza due to other identified influenza virus with other respiratory manifestations: Secondary | ICD-10-CM | POA: Diagnosis not present

## 2018-02-21 DIAGNOSIS — Z20828 Contact with and (suspected) exposure to other viral communicable diseases: Secondary | ICD-10-CM | POA: Diagnosis not present

## 2018-02-21 DIAGNOSIS — R6889 Other general symptoms and signs: Secondary | ICD-10-CM | POA: Diagnosis not present

## 2018-02-21 DIAGNOSIS — R51 Headache: Secondary | ICD-10-CM | POA: Diagnosis not present

## 2018-03-10 DIAGNOSIS — M25551 Pain in right hip: Secondary | ICD-10-CM | POA: Diagnosis not present

## 2018-06-28 ENCOUNTER — Telehealth: Payer: Self-pay | Admitting: *Deleted

## 2018-06-28 NOTE — Telephone Encounter (Signed)
Patient called c/o vaginal bump located in groin area, tender to touch, worried due to hx of MRSA years ago. Size has not increased in the last 2 day, called requesting medication or concerned about what this could be. I explained OV needed to exam and then treat. Transferred to appointment desk to schedule.

## 2018-06-29 ENCOUNTER — Encounter: Payer: Self-pay | Admitting: Women's Health

## 2018-06-29 ENCOUNTER — Ambulatory Visit (INDEPENDENT_AMBULATORY_CARE_PROVIDER_SITE_OTHER): Payer: BLUE CROSS/BLUE SHIELD | Admitting: Women's Health

## 2018-06-29 ENCOUNTER — Other Ambulatory Visit: Payer: Self-pay

## 2018-06-29 VITALS — BP 126/78

## 2018-06-29 DIAGNOSIS — L739 Follicular disorder, unspecified: Secondary | ICD-10-CM

## 2018-06-29 DIAGNOSIS — N898 Other specified noninflammatory disorders of vagina: Secondary | ICD-10-CM | POA: Diagnosis not present

## 2018-06-29 LAB — WET PREP FOR TRICH, YEAST, CLUE

## 2018-06-29 MED ORDER — SULFAMETHOXAZOLE-TRIMETHOPRIM 800-160 MG PO TABS: 1.0000 | ORAL_TABLET | Freq: Two times a day (BID) | ORAL | 0 refills | Status: DC

## 2018-06-29 MED ORDER — METRONIDAZOLE 500 MG PO TABS: 500.0000 mg | ORAL_TABLET | Freq: Two times a day (BID) | ORAL | 0 refills | Status: DC

## 2018-06-29 MED ORDER — DOXYCYCLINE HYCLATE 100 MG PO CAPS: 100.0000 mg | ORAL_CAPSULE | Freq: Two times a day (BID) | ORAL | 0 refills | Status: DC

## 2018-06-29 NOTE — Progress Notes (Signed)
38 year old SBF G5 P3 presents with complaint of questionable lump in right groin area for the past week, not enlarging, no drainage mild erythema.  2014 similar symptoms culture positive for MRSA.  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  Reports scant white vaginal discharge with minimal itching or odor, denies urinary symptoms, abdominal/back pain or fever.  06/2017 ParaGard IUD removed,  monthly cycles using condoms, currently not sexually active.  Negative STD screen 12/2017, denies need for repeat.    Exam: Appears well.  External genitalia superficial 2 cm slightly tender nodule right inner groin non-indurated. Introitus erythemic, speculum exam moderate amount of a white adherent discharge noted, wet prep positive for clues, TNTC bacteria.  Bacterial vaginosis Right groin folliculitis/history of MRSA  Plan: Flagyl 500 twice daily for 7 days, alcohol precautions reviewed.  Doxycycline 100 mg p.o. twice daily for 7 days, encouraged warm soaks, loose clothing.  Instructed to call if fever, area enlarges or becomes more painful.

## 2018-06-29 NOTE — Patient Instructions (Signed)
Bacterial Vaginosis  Bacterial vaginosis is an infection of the vagina. It happens when too many normal germs (healthy bacteria) grow in the vagina. This infection puts you at risk for infections from sex (STIs). Treating this infection can lower your risk for some STIs. You should also treat this if you are pregnant. It can cause your baby to be born early. Follow these instructions at home: Medicines  Take over-the-counter and prescription medicines only as told by your doctor.  Take or use your antibiotic medicine as told by your doctor. Do not stop taking or using it even if you start to feel better. General instructions  If you your sexual partner is a woman, tell her that you have this infection. She needs to get treatment if she has symptoms. If you have a female partner, he does not need to be treated.  During treatment: ? Avoid sex. ? Do not douche. ? Avoid alcohol as told. ? Avoid breastfeeding as told.  Drink enough fluid to keep your pee (urine) clear or pale yellow.  Keep your vagina and butt (rectum) clean. ? Wash the area with warm water every day. ? Wipe from front to back after you use the toilet.  Keep all follow-up visits as told by your doctor. This is important. Preventing this condition  Do not douche.  Use only warm water to wash around your vagina.  Use protection when you have sex. This includes: ? Latex condoms. ? Dental dams.  Limit how many people you have sex with. It is best to only have sex with the same person (be monogamous).  Get tested for STIs. Have your partner get tested.  Wear underwear that is cotton or lined with cotton.  Avoid tight pants and pantyhose. This is most important in summer.  Do not use any products that have nicotine or tobacco in them. These include cigarettes and e-cigarettes. If you need help quitting, ask your doctor.  Do not use illegal drugs.  Limit how much alcohol you drink. Contact a doctor if:  Your  symptoms do not get better, even after you are treated.  You have more discharge or pain when you pee (urinate).  You have a fever.  You have pain in your belly (abdomen).  You have pain with sex.  Your bleed from your vagina between periods. Summary  This infection happens when too many germs (bacteria) grow in the vagina.  Treating this condition can lower your risk for some infections from sex (STIs).  You should also treat this if you are pregnant. It can cause early (premature) birth.  Do not stop taking or using your antibiotic medicine even if you start to feel better. This information is not intended to replace advice given to you by your health care provider. Make sure you discuss any questions you have with your health care provider. Document Released: 11/26/2007 Document Revised: 11/02/2015 Document Reviewed: 11/02/2015 Elsevier Interactive Patient Education  2019 Elsevier Inc. Folliculitis  Folliculitis is inflammation of the hair follicles. Folliculitis most commonly occurs on the scalp, thighs, legs, back, and buttocks. However, it can occur anywhere on the body. What are the causes? This condition may be caused by:  A bacterial infection (common).  A fungal infection.  A viral infection.  Coming into contact with certain chemicals, especially oils and tars.  Shaving or waxing.  Applying greasy ointments or creams to your skin often. Long-lasting folliculitis and folliculitis that keeps coming back can be caused by bacteria that live  in the nostrils. What increases the risk? This condition is more likely to develop in people with:  A weakened immune system.  Diabetes.  Obesity. What are the signs or symptoms? Symptoms of this condition include:  Redness.  Soreness.  Swelling.  Itching.  Small white or yellow, pus-filled, itchy spots (pustules) that appear over a reddened area. If there is an infection that goes deep into the follicle, these  may develop into a boil (furuncle).  A group of closely packed boils (carbuncle). These tend to form in hairy, sweaty areas of the body. How is this diagnosed? This condition is diagnosed with a skin exam. To find what is causing the condition, your health care provider may take a sample of one of the pustules or boils for testing. How is this treated? This condition may be treated by:  Applying warm compresses to the affected areas.  Taking an antibiotic medicine or applying an antibiotic medicine to the skin.  Applying or bathing with an antiseptic solution.  Taking an over-the-counter medicine to help with itching.  Having a procedure to drain any pustules or boils. This may be done if a pustule or boil contains a lot of pus or fluid.  Laser hair removal. This may be done to treat long-lasting folliculitis. Follow these instructions at home:  If directed, apply heat to the affected area as often as told by your health care provider. Use the heat source that your health care provider recommends, such as a moist heat pack or a heating pad. ? Place a towel between your skin and the heat source. ? Leave the heat on for 20-30 minutes. ? Remove the heat if your skin turns bright red. This is especially important if you are unable to feel pain, heat, or cold. You may have a greater risk of getting burned.  If you were prescribed an antibiotic medicine, use it as told by your health care provider. Do not stop using the antibiotic even if you start to feel better.  Take over-the-counter and prescription medicines only as told by your health care provider.  Do not shave irritated skin.  Keep all follow-up visits as told by your health care provider. This is important. Get help right away if:  You have more redness, swelling, or pain in the affected area.  Red streaks are spreading from the affected area.  You have a fever. This information is not intended to replace advice given to  you by your health care provider. Make sure you discuss any questions you have with your health care provider. Document Released: 04/27/2001 Document Revised: 09/06/2015 Document Reviewed: 12/07/2014 Elsevier Interactive Patient Education  2019 [employer]

## 2018-08-02 ENCOUNTER — Other Ambulatory Visit: Payer: Self-pay | Admitting: Women's Health

## 2018-08-02 DIAGNOSIS — B009 Herpesviral infection, unspecified: Secondary | ICD-10-CM

## 2018-08-03 NOTE — Telephone Encounter (Signed)
Okay for refill?  

## 2018-09-03 ENCOUNTER — Ambulatory Visit (HOSPITAL_COMMUNITY)
Admission: EM | Admit: 2018-09-03 | Discharge: 2018-09-03 | Disposition: A | Discharge status: Home / Self Care | Payer: BC Managed Care – PPO | Attending: Family Medicine | Admitting: Family Medicine

## 2018-09-03 ENCOUNTER — Other Ambulatory Visit: Payer: Self-pay

## 2018-09-03 ENCOUNTER — Encounter (HOSPITAL_COMMUNITY): Payer: Self-pay

## 2018-09-03 ENCOUNTER — Ambulatory Visit (INDEPENDENT_AMBULATORY_CARE_PROVIDER_SITE_OTHER): Payer: BC Managed Care – PPO

## 2018-09-03 DIAGNOSIS — R0789 Other chest pain: Secondary | ICD-10-CM

## 2018-09-03 DIAGNOSIS — R079 Chest pain, unspecified: Secondary | ICD-10-CM | POA: Diagnosis not present

## 2018-09-03 NOTE — Discharge Instructions (Addendum)
You have been seen at the [facility] today for chest pain. Your evaluation today was not suggestive of any emergent condition requiring medical intervention at this time. Your ECG (heart tracing) did not show any worrisome changes. However, some medical problems make take more time to appear. Therefore, it's very important that you pay attention to any new symptoms or worsening of your current condition.  Please proceed directly to the Emergency Department immediately should you feel worse in any way or have any of the following symptoms: increasing or different chest pain, pain that spreads to your arm, neck, jaw, back or abdomen, shortness of breath, or nausea and vomiting.  

## 2018-09-03 NOTE — ED Triage Notes (Signed)
Patient presents to Urgent Care with complaints of centralized chest pain since 1200 today. Patient reports the pain was sharp, pt also reports right sided neck pain and then she developed right sided headache. Pt denies pain at this time, referred here by nurse advice line.

## 2018-09-06 NOTE — ED Provider Notes (Signed)
[place]   [id number] 09/03/18 Arrival Time: 1602  ASSESSMENT & PLAN:  1. Atypical chest pain    Reassured. Patient history and exam consistent with non-cardiac cause of chest pain. Conservative measures indicated. Worsening signs and symptoms discussed and patient verbalized understanding. No suspicion for PE.  ECG: Performed today and interpreted by me: NSR; no STEMI  I have personally viewed the imaging studies ordered this visit. Normal CXR.  Chest pain precautions given. Reviewed expectations re: course of current medical issues. Questions answered. Outlined signs and symptoms indicating need for more acute intervention. Patient verbalized understanding. After Visit Summary given.   SUBJECTIVE: History from: patient. Renee Clark is a 38 y.o. female who presents with complaint of intermittent midline upper discomfort described as dull that does not radiate. Onset gradual, today. Reports symptoms are "pretty much gone" since beginning. Lasted several minutes. Injury or recent strenuous activity: none. When present, rates as 2/10 in intensity when present; without associated n/v; without associated SOB.` Denies: dyspnea, fatigue, irregular heart beat, lower extremity edema, near-syncope, orthopnea and palpitations. Aggravating factors: have not been identified. Alleviating factors: have not been identified. Recent illnesses: none. Fever: absent. Ambulatory without assistance. Self/OTC treatment: none. History of similar: no. No new medications. Illicit drug use: none. Social History   Tobacco Use  Smoking Status Never Smoker  Smokeless Tobacco Never Used   ROS: As per HPI. All other systems negative.   OBJECTIVE:  Vitals:   09/03/18 1617  BP: 130/86  Pulse: 63  Resp: 16  Temp: 98.7 F (37.1 C)  TempSrc: Oral  SpO2: 100%    General appearance: alert, oriented, no acute distress Eyes: PERRLA; EOMI; conjunctivae normal HENT: normocephalic;  atraumatic Neck: supple with FROM Lungs: without labored respirations; CTAB Heart: regular rate and rhythm without murmer Chest Wall: without tenderness to palpation Abdomen: soft, non-tender; bowel sounds normal; no masses or organomegaly; no guarding or rebound tenderness Extremities: without edema; without calf swelling or tenderness; symmetrical without gross deformities Skin: warm and dry; without rash or lesions Psychological: alert and cooperative; normal mood and affect   No Known Allergies  Past Medical History:  Diagnosis Date  . HSV (herpes simplex virus) infection    Social History   Socioeconomic History  . Marital status: Single    Spouse name: Not on file  . Number of children: Not on file  . Years of education: Not on file  . Highest education level: Not on file  Occupational History  . Not on file  Social Needs  . Financial resource strain: Not on file  . Food insecurity    Worry: Not on file    Inability: Not on file  . Transportation needs    Medical: Not on file    Non-medical: Not on file  Tobacco Use  . Smoking status: Never Smoker  . Smokeless tobacco: Never Used  Substance and Sexual Activity  . Alcohol use: Yes    Alcohol/week: 0.0 standard drinks    Comment: social  . Drug use: No  . Sexual activity: Not Currently    Partners: Male    Birth control/protection: Condom  Lifestyle  . Physical activity    Days per week: Not on file    Minutes per session: Not on file  . Stress: Not on file  Relationships  . Social Herbalist on phone: Not on file    Gets together: Not on file    Attends religious service: Not on file  Active member of club or organization: Not on file    Attends meetings of clubs or organizations: Not on file    Relationship status: Not on file  . Intimate partner violence    Fear of current or ex partner: Not on file    Emotionally abused: Not on file    Physically abused: Not on file    Forced sexual  activity: Not on file  Other Topics Concern  . Not on file  Social History Narrative  . Not on file   Family History  Problem Relation Age of Onset  . Breast cancer Maternal Aunt   . Hypertension Mother   . Diabetes Maternal Grandfather   . Hypertension Maternal Grandfather   . Healthy Father    Past Surgical History:  Procedure Laterality Date  . HYSTEROSCOPY    . INTRAUTERINE DEVICE INSERTION  2012   [facility]     Curties Conigliaro, MD 09/06/18 [phone]

## 2018-10-12 ENCOUNTER — Other Ambulatory Visit: Payer: Self-pay

## 2018-10-12 ENCOUNTER — Ambulatory Visit: Payer: BC Managed Care – PPO | Admitting: Women's Health

## 2018-10-12 ENCOUNTER — Encounter: Payer: Self-pay | Admitting: Women's Health

## 2018-10-12 VITALS — BP 128/88 | Wt 204.0 lb

## 2018-10-12 DIAGNOSIS — N898 Other specified noninflammatory disorders of vagina: Secondary | ICD-10-CM

## 2018-10-12 LAB — WET PREP FOR TRICH, YEAST, CLUE

## 2018-10-12 MED ORDER — NYSTATIN-TRIAMCINOLONE 100000-0.1 UNIT/GM-% EX OINT: 1.0000 "application " | TOPICAL_OINTMENT | Freq: Two times a day (BID) | CUTANEOUS | 0 refills | Status: DC

## 2018-10-12 NOTE — Progress Notes (Signed)
38 year old SBF G5, P3 presents with complaint of vaginal irritation with itching near clitoral hood for the last month, initially thought questionable HSV took Valtrex with no relief of symptoms, over-the-counter Monistat no relief.  Minimal white discharge more of a annoying irritation with itching .  Denies any urinary symptoms, abdominal pain, nausea or fever.  No known medical problems.  Regular monthly cycle/not sexually active in about a year denies need for STD screen.  Has had problems with recurrent BV in the past and does not think it is BV.  HSV no outbreaks.  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  Exam: Appears well.  External genitalia mild erythema at clitoral hood on right side no visible lesion, blister, external genitalia and introitus without erythema, speculum exam moderate amount of a white discharge,  vaginal walls within normal limits, wet prep negative.  Bimanual no CMT or adnexal tenderness.  Vaginal irritation with itching at clitoral hood  Plan: Apply small amount of Mycolog twice daily to affected area, loose clothing, clean and dry, call if no relief of symptoms.

## 2018-10-12 NOTE — Patient Instructions (Signed)
Vaginal Yeast infection, Adult  Vaginal yeast infection is a condition that causes vaginal discharge as well as soreness, swelling, and redness (inflammation) of the vagina. This is a common condition. Some women get this infection frequently. What are the causes? This condition is caused by a change in the normal balance of the yeast (candida) and bacteria that live in the vagina. This change causes an overgrowth of yeast, which causes the inflammation. What increases the risk? The condition is more likely to develop in women who:  Take antibiotic medicines.  Have diabetes.  Take birth control pills.  Are pregnant.  Douche often.  Have a weak body defense system (immune system).  Have been taking steroid medicines for a long time.  Frequently wear tight clothing. What are the signs or symptoms? Symptoms of this condition include:  White, thick, creamy vaginal discharge.  Swelling, itching, redness, and irritation of the vagina. The lips of the vagina (vulva) may be affected as well.  Pain or a burning feeling while urinating.  Pain during sex. How is this diagnosed? This condition is diagnosed based on:  Your medical history.  A physical exam.  A pelvic exam. Your health care provider will examine a sample of your vaginal discharge under a microscope. Your health care provider may send this sample for testing to confirm the diagnosis. How is this treated? This condition is treated with medicine. Medicines may be over-the-counter or prescription. You may be told to use one or more of the following:  Medicine that is taken by mouth (orally).  Medicine that is applied as a cream (topically).  Medicine that is inserted directly into the vagina (suppository). Follow these instructions at home:  Lifestyle  Do not have sex until your health care provider approves. Tell your sex partner that you have a yeast infection. That person should go to his or her health care  provider and ask if they should also be treated.  Do not wear tight clothes, such as pantyhose or tight pants.  Wear breathable cotton underwear. General instructions  Take or apply over-the-counter and prescription medicines only as told by your health care provider.  Eat more yogurt. This may help to keep your yeast infection from returning.  Do not use tampons until your health care provider approves.  Try taking a sitz bath to help with discomfort. This is a warm water bath that is taken while you are sitting down. The water should only come up to your hips and should cover your buttocks. Do this 3-4 times per day or as told by your health care provider.  Do not douche.  If you have diabetes, keep your blood sugar levels under control.  Keep all follow-up visits as told by your health care provider. This is important. Contact a health care provider if:  You have a fever.  Your symptoms go away and then return.  Your symptoms do not get better with treatment.  Your symptoms get worse.  You have new symptoms.  You develop blisters in or around your vagina.  You have blood coming from your vagina and it is not your menstrual period.  You develop pain in your abdomen. Summary  Vaginal yeast infection is a condition that causes discharge as well as soreness, swelling, and redness (inflammation) of the vagina.  This condition is treated with medicine. Medicines may be over-the-counter or prescription.  Take or apply over-the-counter and prescription medicines only as told by your health care provider.  Do not douche.   Do not have sex or use tampons until your health care provider approves.  Contact a health care provider if your symptoms do not get better with treatment or your symptoms go away and then return. This information is not intended to replace advice given to you by your health care provider. Make sure you discuss any questions you have with your health care  provider. Document Released: 11/26/2004 Document Revised: 07/05/2017 Document Reviewed: 07/05/2017 Elsevier Patient Education  2020 [employer]  

## 2018-12-06 ENCOUNTER — Emergency Department (HOSPITAL_COMMUNITY): Payer: BC Managed Care – PPO

## 2018-12-06 ENCOUNTER — Other Ambulatory Visit: Payer: Self-pay

## 2018-12-06 ENCOUNTER — Encounter (HOSPITAL_COMMUNITY): Payer: Self-pay | Admitting: Emergency Medicine

## 2018-12-06 ENCOUNTER — Encounter (HOSPITAL_COMMUNITY): Payer: Self-pay

## 2018-12-06 ENCOUNTER — Ambulatory Visit (HOSPITAL_COMMUNITY)
Admission: EM | Admit: 2018-12-06 | Discharge: 2018-12-06 | Disposition: A | Discharge status: Home / Self Care | Payer: BC Managed Care – PPO | Source: Home / Self Care

## 2018-12-06 ENCOUNTER — Emergency Department (HOSPITAL_COMMUNITY)
Admission: EM | Admit: 2018-12-06 | Discharge: 2018-12-07 | Disposition: A | Discharge status: Home / Self Care | Payer: BC Managed Care – PPO | Attending: Emergency Medicine | Admitting: Emergency Medicine

## 2018-12-06 DIAGNOSIS — Z79899 Other long term (current) drug therapy: Secondary | ICD-10-CM | POA: Insufficient documentation

## 2018-12-06 DIAGNOSIS — R079 Chest pain, unspecified: Secondary | ICD-10-CM | POA: Diagnosis not present

## 2018-12-06 DIAGNOSIS — R0789 Other chest pain: Secondary | ICD-10-CM

## 2018-12-06 LAB — I-STAT BETA HCG BLOOD, ED (MC, WL, AP ONLY): I-stat hCG, quantitative: <5 m[IU]/mL (ref <5)

## 2018-12-06 LAB — CBC
HCT: 39.4 % (ref 36.0–46.0)
Hemoglobin: 12.9 g/dL (ref 12.0–15.0)
MCH: 30.3 pg (ref 26.0–34.0)
MCHC: 32.7 g/dL (ref 30.0–36.0)
MCV: 92.5 fL (ref 80.0–100.0)
Platelets: 317 10*3/uL (ref 150–400)
RBC: 4.26 MIL/uL (ref 3.87–5.11)
RDW: 12.4 % (ref 11.5–15.5)
WBC: 5.1 10*3/uL (ref 4.0–10.5)
nRBC: 0 % (ref 0.0–0.2)

## 2018-12-06 LAB — BASIC METABOLIC PANEL
Anion gap: 7 (ref 5–15)
BUN: 9 mg/dL (ref 6–20)
CO2: 26 mmol/L (ref 22–32)
Calcium: 9 mg/dL (ref 8.9–10.3)
Chloride: 102 mmol/L (ref 98–111)
Creatinine, Ser: 0.71 mg/dL (ref 0.44–1.00)
GFR calc Af Amer: >60 mL/min (ref >60)
GFR calc non Af Amer: >60 mL/min (ref >60)
Glucose, Bld: 102 mg/dL — ABNORMAL HIGH (ref 70–99)
Potassium: 3.8 mmol/L (ref 3.5–5.1)
Sodium: 135 mmol/L (ref 135–145)

## 2018-12-06 LAB — TROPONIN I (HIGH SENSITIVITY): Troponin I (High Sensitivity): <2 ng/L (ref <18)

## 2018-12-06 MED ORDER — SODIUM CHLORIDE 0.9% FLUSH: 3.0000 mL | Freq: Once | INTRAVENOUS | Status: DC

## 2018-12-06 NOTE — ED Triage Notes (Signed)
Pt reports she is experiencing 2 second "spasam sharp pain" under her left breast every 60 seconds.  Denies any other symptoms at this time.  Started around 2pm this afternoon.

## 2018-12-06 NOTE — ED Provider Notes (Addendum)
[place]    CSN: [id number] Arrival date & time: 12/06/18  7371      History   Chief Complaint Chief Complaint  Patient presents with  . Chest Pain    HPI Renee Clark is a 38 y.o. female.   Patient presents with pain under her left breast and rib cage intermittently since yesterday.  She states the pain is sharp, lasts 1-2 seconds, and occurs every minute; she rates it 2/10.   She denies SOB, nausea, diaphoresis, weakness, dizziness, or other symptoms.  No treatment attempted at home.  She has a history of atypical chest pain in July 2020; she states this does not feel like the episode at that time.    The history is provided by the patient.    Past Medical History:  Diagnosis Date  . HSV (herpes simplex virus) infection     Patient Active Problem List   Diagnosis Date Noted  . Bacterial vaginosis 06/22/2014  . HSV (herpes simplex virus) infection     Past Surgical History:  Procedure Laterality Date  . HYSTEROSCOPY    . INTRAUTERINE DEVICE INSERTION  2012   [facility]ard    OB History    Gravida  5   Para      Term      Preterm      AB  2   Living  3     SAB      TAB      Ectopic      Multiple      Live Births               Home Medications    Prior to Admission medications   Medication Sig Start Date End Date Taking? Authorizing Provider  Multiple Vitamin (MULTIVITAMIN) tablet Take 1 tablet by mouth daily.   Yes [provider]  doxycycline (VIBRAMYCIN) 100 MG capsule Take 1 capsule (100 mg total) by mouth 2 (two) times daily. Patient not taking: Reported on 10/12/2018 06/29/18   Huel Cote, NP  metroNIDAZOLE (FLAGYL) 500 MG tablet Take 1 tablet (500 mg total) by mouth 2 (two) times daily. Patient not taking: Reported on 10/12/2018 06/29/18   Huel Cote, NP  nystatin-triamcinolone ointment [facility]) Apply 1 application topically 2 (two) times daily. 10/12/18   Huel Cote, NP  valACYclovir (VALTREX) 500 MG  tablet TAKE 1 TABLET BY MOUTH TWICE DAILY FOR 3 TO 5 DAYS AS NEEDED Patient not taking: Reported on 10/12/2018 08/03/18   Huel Cote, NP    Family History Family History  Problem Relation Age of Onset  . Breast cancer Maternal Aunt   . Hypertension Mother   . Diabetes Maternal Grandfather   . Hypertension Maternal Grandfather   . Healthy Father     Social History Social History   Tobacco Use  . Smoking status: Never Smoker  . Smokeless tobacco: Never Used  Substance Use Topics  . Alcohol use: Yes    Alcohol/week: 0.0 standard drinks    Comment: social  . Drug use: No     Allergies   Patient has no known allergies.   Review of Systems Review of Systems  Constitutional: Negative for chills and fever.  HENT: Negative for ear pain and sore throat.   Eyes: Negative for pain and visual disturbance.  Respiratory: Negative for cough and shortness of breath.   Cardiovascular: Positive for chest pain. Negative for palpitations and leg swelling.  Gastrointestinal: Negative for abdominal pain and vomiting.  Genitourinary: Negative for dysuria and hematuria.  Musculoskeletal: Negative for arthralgias and back pain.  Skin: Negative for color change and rash.  Neurological: Negative for dizziness, seizures, syncope and weakness.  All other systems reviewed and are negative.    Physical Exam Triage Vital Signs ED Triage Vitals  Enc Vitals Group     BP      Pulse      Resp      Temp      Temp src      SpO2      Weight      Height      Head Circumference      Peak Flow      Pain Score      Pain Loc      Pain Edu?      Excl. in GC?    No data found.  Updated Vital Signs BP 121/86 (BP Location: Right Arm)   Pulse 76   Temp 98.5 F (36.9 C) (Oral)   Resp 16   SpO2 99%   Visual Acuity Right Eye Distance:   Left Eye Distance:   Bilateral Distance:    Right Eye Near:   Left Eye Near:    Bilateral Near:     Physical Exam Vitals signs and nursing note  reviewed.  Constitutional:      General: She is not in acute distress.    Appearance: She is well-developed.  HENT:     Head: Normocephalic and atraumatic.     Mouth/Throat:     Mouth: Mucous membranes are moist.     Pharynx: Oropharynx is clear.  Eyes:     Conjunctiva/sclera: Conjunctivae normal.  Neck:     Musculoskeletal: Neck supple.  Cardiovascular:     Rate and Rhythm: Normal rate and regular rhythm.     Heart sounds: Normal heart sounds. No murmur.  Pulmonary:     Effort: Pulmonary effort is normal. No respiratory distress.     Breath sounds: Normal breath sounds.  Abdominal:     General: Bowel sounds are normal.     Palpations: Abdomen is soft.     Tenderness: There is no abdominal tenderness. There is no guarding or rebound.  Skin:    General: Skin is warm and dry.  Neurological:     General: No focal deficit present.     Mental Status: She is alert and oriented to person, place, and time.     Sensory: No sensory deficit.     Motor: No weakness.     Gait: Gait normal.      UC Treatments / Results  Labs (all labs ordered are listed, but only abnormal results are displayed) Labs Reviewed - No data to display  EKG   Radiology No results found.  Procedures Procedures (including critical care time)  Medications Ordered in UC Medications - No data to display  Initial Impression / Assessment and Plan / UC Course  I have reviewed the triage vital signs and the nursing notes.  Pertinent labs & imaging results that were available during my care of the patient were reviewed by me and considered in my medical decision making (see chart for details).   Atypical chest pain.  EKG shows normal sinus rhythm with a rate of 68, low voltage QRS, no ST elevation.  Instructed patient to go to the emergency department if she has continued or worsening chest pain, or develops shortness of breath, weakness, dizziness, or other concerning symptoms.  Discussed with  patient at  length that we cannot rule out a cardiac event here and that she should go to the ED if she continues to have pain.  Patient agrees with plan of care.      Final Clinical Impressions(s) / UC Diagnoses   Final diagnoses:  Atypical chest pain     Discharge Instructions     Go to the emergency department if you have acute chest pain, shortness of breath, weakness, dizziness, or other concerning symptoms.      ED Prescriptions    None     PDMP not reviewed this encounter.   Mickie Bailate, Nicha Hemann H, NP 12/06/18 1950    Mickie Bailate, Yalanda Soderman H, NP 12/06/18 1952    Mickie Bailate, Opie Maclaughlin H, NP 12/06/18 [phone]

## 2018-12-06 NOTE — ED Triage Notes (Addendum)
Pt report she is having chest pain x 1 day, under her left breast. The pain resembles a contraction.

## 2018-12-06 NOTE — Discharge Instructions (Signed)
Go to the emergency department if you have acute chest pain, shortness of breath, weakness, dizziness, or other concerning symptoms.

## 2018-12-07 LAB — TROPONIN I (HIGH SENSITIVITY): Troponin I (High Sensitivity): <2 ng/L (ref <18)

## 2018-12-07 NOTE — Discharge Instructions (Addendum)
Use Tylenol or ibuprofen as needed for pain. Follow-up with primary care doctor for symptoms not improving.  If you do not have a primary care doctor, you may call the number listed in the paperwork to help set up primary care. Return to the emergency room with any new, worsening, concerning symptoms.

## 2018-12-07 NOTE — ED Provider Notes (Signed)
[place] Provider Note   CSN: [id number] Arrival date & time: 12/06/18  2004     History   Chief Complaint Chief Complaint  Patient presents with  . Chest Pain    HPI Renee Clark is a 38 y.o. female presenting for evaluation of chest pain.  Patient states since 2 PM she has been having intermittent sharp left-sided chest pain.  It occurs about every 60 seconds, last for half a second before resolving.  It feels like a spasm.  She has not taken anything for this.  She cannot do anything to bring the pain on.  It is not worse with exertion or movement.  She denies previous cardiac history.  She denies recent fevers, chills, cough.  She denies associated shortness of breath, nausea, vomiting, or diaphoresis.  She denies a history of diabetes or hypertension.  She denies recent travel, surgeries, immobilization, history of cancer, history of previous DVT/PE, or hormone use.     HPI  Past Medical History:  Diagnosis Date  . HSV (herpes simplex virus) infection     Patient Active Problem List   Diagnosis Date Noted  . Bacterial vaginosis 06/22/2014  . HSV (herpes simplex virus) infection     Past Surgical History:  Procedure Laterality Date  . HYSTEROSCOPY    . INTRAUTERINE DEVICE INSERTION  2012   [facility]ard     OB History    Gravida  5   Para      Term      Preterm      AB  2   Living  3     SAB      TAB      Ectopic      Multiple      Live Births               Home Medications    Prior to Admission medications   Medication Sig Start Date End Date Taking? Authorizing Provider  doxycycline (VIBRAMYCIN) 100 MG capsule Take 1 capsule (100 mg total) by mouth 2 (two) times daily. Patient not taking: Reported on 10/12/2018 06/29/18   Huel Cote, NP  metroNIDAZOLE (FLAGYL) 500 MG tablet Take 1 tablet (500 mg total) by mouth 2 (two) times daily. Patient not taking: Reported on 10/12/2018 06/29/18   Huel Cote, NP  Multiple Vitamin (MULTIVITAMIN) tablet Take 1 tablet by mouth daily.    [provider]  nystatin-triamcinolone ointment (MYCOLOG) Apply 1 application topically 2 (two) times daily. 10/12/18   Huel Cote, NP  valACYclovir (VALTREX) 500 MG tablet TAKE 1 TABLET BY MOUTH TWICE DAILY FOR 3 TO 5 DAYS AS NEEDED Patient not taking: Reported on 10/12/2018 08/03/18   Huel Cote, NP    Family History Family History  Problem Relation Age of Onset  . Breast cancer Maternal Aunt   . Hypertension Mother   . Diabetes Maternal Grandfather   . Hypertension Maternal Grandfather   . Healthy Father     Social History Social History   Tobacco Use  . Smoking status: Never Smoker  . Smokeless tobacco: Never Used  Substance Use Topics  . Alcohol use: Yes    Alcohol/week: 0.0 standard drinks    Comment: social  . Drug use: No     Allergies   Patient has no known allergies.   Review of Systems Review of Systems  Cardiovascular: Positive for chest pain.  All other systems reviewed and are negative.  Physical Exam Updated Vital Signs BP 122/75 (BP Location: Right Arm)   Pulse 85   Temp 98.6 F (37 C)   Resp 16   Ht 5\' 9"  (1.753 m)   Wt 90.7 kg   SpO2 100%   BMI 29.53 kg/m   Physical Exam Vitals signs and nursing note reviewed.  Constitutional:      General: She is not in acute distress.    Appearance: She is well-developed.     Comments: Resting comfortably in bed in no acute distress.  HENT:     Head: Normocephalic and atraumatic.  Eyes:     Conjunctiva/sclera: Conjunctivae normal.     Pupils: Pupils are equal, round, and reactive to light.  Neck:     Musculoskeletal: Normal range of motion and neck supple.  Cardiovascular:     Rate and Rhythm: Normal rate and regular rhythm.     Pulses: Normal pulses.  Pulmonary:     Effort: Pulmonary effort is normal. No respiratory distress.     Breath sounds: Normal breath sounds. No wheezing.      Comments: Speaking in full sentences.  Clear lung sounds in all fields.  No tenderness palpation of the chest wall Abdominal:     General: There is no distension.     Palpations: Abdomen is soft. There is no mass.     Tenderness: There is no abdominal tenderness. There is no guarding or rebound.  Musculoskeletal: Normal range of motion.     Right lower leg: No edema.     Left lower leg: No edema.  Skin:    General: Skin is warm and dry.  Neurological:     Mental Status: She is alert and oriented to person, place, and time.      ED Treatments / Results  Labs (all labs ordered are listed, but only abnormal results are displayed) Labs Reviewed  BASIC METABOLIC PANEL - Abnormal; Notable for the following components:      Result Value   Glucose, Bld 102 (*)    All other components within normal limits  CBC  I-STAT BETA HCG BLOOD, ED ([facility], [facility], [facility] ONLY)  TROPONIN I (HIGH SENSITIVITY)  TROPONIN I (HIGH SENSITIVITY)    EKG EKG Interpretation  Date/Time:  Wednesday December 07 2018 01:36:43 EDT Ventricular Rate:  58 PR Interval:    QRS Duration: 99 QT Interval:  452 QTC Calculation: 444 R Axis:   60 Text Interpretation:  Sinus rhythm Low voltage, extremity and precordial leads Confirmed by [id number] (Ross Marcus) on 12/07/2018 1:39:26 AM   Radiology Dg Chest 2 View  Result Date: 12/06/2018 CLINICAL DATA:  Left-sided chest pain for 1 day. EXAM: CHEST - 2 VIEW COMPARISON:  None. FINDINGS: The heart size and mediastinal contours are within normal limits. Both lungs are clear. No evidence of pneumothorax or pleural effusion. The visualized skeletal structures are unremarkable. IMPRESSION: Normal study. Electronically Signed   By: 02/05/2019 M.D.   On: 12/06/2018 20:35    Procedures Procedures (including critical care time)  Medications Ordered in ED Medications  sodium chloride flush (NS) 0.9 % injection 3 mL (has no administration in time range)     Initial Impression /  Assessment and Plan / ED Course  I have reviewed the triage vital signs and the nursing notes.  Pertinent labs & imaging results that were available during my care of the patient were reviewed by me and considered in my medical decision making (see chart for details).  Patient presenting for evaluation of intermittent sharp chest pain.  Pain is described like a spasm.  Physical exam reassuring, she appears nontoxic.  Low suspicion for ACS, due to patient's lack of risk factors and description of the pain, however will obtain cardiac work-up to ensure no abnormality.  Patient is PERC negative, doubt PE.  Labs reassuring.  No leukocytosis.  Electrolytes stable.  Troponin negative x2.  Chest x-ray viewed interpreted by me, no pneumonia, pneumothorax, effusion, cardiomegaly.  EKG without STEMI.  Discussed findings with patient.  Discussed treatment Tylenol and ibuprofen, follow-up with primary care if symptoms not proving.  At this time, patient appears safe for discharge.  Return precautions given.  Patient states she understands and agrees to plan.   Final Clinical Impressions(s) / ED Diagnoses   Final diagnoses:  Atypical chest pain    ED Discharge Orders    None       Alveria ApleyCaccavale, Tyanne Derocher, PA-C 12/07/18 0241    Shon BatonHorton, Courtney F, MD 12/07/18 [phone]

## 2019-01-17 ENCOUNTER — Ambulatory Visit (INDEPENDENT_AMBULATORY_CARE_PROVIDER_SITE_OTHER): Payer: BC Managed Care – PPO | Admitting: Women's Health

## 2019-01-17 ENCOUNTER — Other Ambulatory Visit: Payer: Self-pay

## 2019-01-17 ENCOUNTER — Encounter: Payer: Self-pay | Admitting: Women's Health

## 2019-01-17 VITALS — BP 112/74 | Ht 69.0 in | Wt 198.6 lb

## 2019-01-17 DIAGNOSIS — Z1151 Encounter for screening for human papillomavirus (HPV): Secondary | ICD-10-CM | POA: Diagnosis not present

## 2019-01-17 DIAGNOSIS — Z01419 Encounter for gynecological examination (general) (routine) without abnormal findings: Secondary | ICD-10-CM | POA: Diagnosis not present

## 2019-01-17 DIAGNOSIS — Z13220 Encounter for screening for lipoid disorders: Secondary | ICD-10-CM

## 2019-01-17 DIAGNOSIS — R87628 Other abnormal cytological findings on specimens from vagina: Secondary | ICD-10-CM | POA: Diagnosis not present

## 2019-01-17 DIAGNOSIS — N920 Excessive and frequent menstruation with regular cycle: Secondary | ICD-10-CM

## 2019-01-17 DIAGNOSIS — Z113 Encounter for screening for infections with a predominantly sexual mode of transmission: Secondary | ICD-10-CM

## 2019-01-17 NOTE — Progress Notes (Signed)
Renee Clark 10/21/1980 [id number]    History:    Presents for annual exam.  Regular monthly cycles heavy flow.  Denies need for contraception not sexually active. HSV rare outbreaks.  Had a ParaGard IUD but had extremely heavy crampy periods was removed.  Normal Pap history.  Normal CBC, CMP 12/2018 at the hospital was having heart palpitations no abnormalities were found.  Past medical history, past surgical history, family history and social history were all reviewed and documented in the EPIC chart.  Works from home.  Children are ages 31, 91 and 27 all doing well.  Oldest child has had Gardasil.  History of a labial abscess no further problems.  Mother hypertension, maternal aunt breast cancer survivor.  ROS:  A ROS was performed and pertinent positives and negatives are included.  Exam:  Vitals:   01/17/19 1134  BP: 112/74  Weight: 198 lb 9.6 oz (90.1 kg)  Height: 5\' 9"  (1.753 m)   Body mass index is 29.33 kg/m.   General appearance:  Normal Thyroid:  Symmetrical, normal in size, without palpable masses or nodularity. Respiratory  Auscultation:  Clear without wheezing or rhonchi Cardiovascular  Auscultation:  Regular rate, without rubs, murmurs or gallops  Edema/varicosities:  Not grossly evident Abdominal  Soft,nontender, without masses, guarding or rebound.  Liver/spleen:  No organomegaly noted  Hernia:  None appreciated  Skin  Inspection:  Grossly normal   Breasts: Examined lying and sitting.     Right: Without masses, retractions, discharge or axillary adenopathy.     Left: Without masses, retractions, discharge or axillary adenopathy. Gentitourinary   Inguinal/mons:  Normal without inguinal adenopathy  External genitalia:  Normal  BUS/Urethra/Skene's glands:  Normal  Vagina:  Normal  Cervix:  Normal  Uterus:   normal in size, shape and contour.  Midline and mobile  Adnexa/parametria:     Rt: Without masses or tenderness.   Lt: Without masses or  tenderness.  Anus and perineum: Normal  Digital rectal exam: Normal sphincter tone without palpated masses or tenderness  Assessment/Plan:  38 y.o. SBF G5, P3 for annual exam with no complaints.  Regular monthly cycles/menorrhagia HSV-rare outbreaks STD screen per request  Plan: Schedule ultrasound, reviewed possible fibroids may be causing menorrhagia.  Valtrex 500 twice daily for 3 to 5 days as needed prescription given.  SBEs, annual screening mammogram at 40, calcium rich foods, vitamin D 1000 daily encouraged.  Continue healthy lifestyle of regular exercise, decrease calorie/carbs for weight loss.  Contraception discussed, declines at this time.  TSH, lipid panel, GC/chlamydia, HIV, RPR, Pap with HR HPV typing, new screening guidelines reviewed.    [name], 11:41 AM 01/17/2019

## 2019-01-17 NOTE — Patient Instructions (Addendum)
Vit D3 1000 iu  Good to see you!  Health Maintenance, Female Adopting a healthy lifestyle and getting preventive care are important in promoting health and wellness. Ask your health care provider about:  The right schedule for you to have regular tests and exams.  Things you can do on your own to prevent diseases and keep yourself healthy. What should I know about diet, weight, and exercise? Eat a healthy diet   Eat a diet that includes plenty of vegetables, fruits, low-fat dairy products, and lean protein.  Do not eat a lot of foods that are high in solid fats, added sugars, or sodium. Maintain a healthy weight Body mass index (BMI) is used to identify weight problems. It estimates body fat based on height and weight. Your health care provider can help determine your BMI and help you achieve or maintain a healthy weight. Get regular exercise Get regular exercise. This is one of the most important things you can do for your health. Most adults should:  Exercise for at least 150 minutes each week. The exercise should increase your heart rate and make you sweat (moderate-intensity exercise).  Do strengthening exercises at least twice a week. This is in addition to the moderate-intensity exercise.  Spend less time sitting. Even light physical activity can be beneficial. Watch cholesterol and blood lipids Have your blood tested for lipids and cholesterol at 39 years of age, then have this test every 5 years. Have your cholesterol levels checked more often if:  Your lipid or cholesterol levels are high.  You are older than 39 years of age.  You are at high risk for heart disease. What should I know about cancer screening? Depending on your health history and family history, you may need to have cancer screening at various ages. This may include screening for:  Breast cancer.  Cervical cancer.  Colorectal cancer.  Skin cancer.  Lung cancer. What should I know about heart  disease, diabetes, and high blood pressure? Blood pressure and heart disease  High blood pressure causes heart disease and increases the risk of stroke. This is more likely to develop in people who have high blood pressure readings, are of African descent, or are overweight.  Have your blood pressure checked: ? Every 3-5 years if you are 21-43 years of age. ? Every year if you are 41 years old or older. Diabetes Have regular diabetes screenings. This checks your fasting blood sugar level. Have the screening done:  Once every three years after age 79 if you are at a normal weight and have a low risk for diabetes.  More often and at a younger age if you are overweight or have a high risk for diabetes. What should I know about preventing infection? Hepatitis B If you have a higher risk for hepatitis B, you should be screened for this virus. Talk with your health care provider to find out if you are at risk for hepatitis B infection. Hepatitis C Testing is recommended for:  Everyone born from [birth year] through 1966.  Anyone with known risk factors for hepatitis C. Sexually transmitted infections (STIs)  Get screened for STIs, including gonorrhea and chlamydia, if: ? You are sexually active and are younger than 39 years of age. ? You are older than 39 years of age and your health care provider tells you that you are at risk for this type of infection. ? Your sexual activity has changed since you were last screened, and you are at increased risk  for chlamydia or gonorrhea. Ask your health care provider if you are at risk.  Ask your health care provider about whether you are at high risk for HIV. Your health care provider may recommend a prescription medicine to help prevent HIV infection. If you choose to take medicine to prevent HIV, you should first get tested for HIV. You should then be tested every 3 months for as long as you are taking the medicine. Pregnancy  If you are about to stop  having your period (premenopausal) and you may become pregnant, seek counseling before you get pregnant.  Take 400 to 800 micrograms (mcg) of folic acid every day if you become pregnant.  Ask for birth control (contraception) if you want to prevent pregnancy. Osteoporosis and menopause Osteoporosis is a disease in which the bones lose minerals and strength with aging. This can result in bone fractures. If you are 40 years old or older, or if you are at risk for osteoporosis and fractures, ask your health care provider if you should:  Be screened for bone loss.  Take a calcium or vitamin D supplement to lower your risk of fractures.  Be given hormone replacement therapy (HRT) to treat symptoms of menopause. Follow these instructions at home: Lifestyle  Do not use any products that contain nicotine or tobacco, such as cigarettes, e-cigarettes, and chewing tobacco. If you need help quitting, ask your health care provider.  Do not use street drugs.  Do not share needles.  Ask your health care provider for help if you need support or information about quitting drugs. Alcohol use  Do not drink alcohol if: ? Your health care provider tells you not to drink. ? You are pregnant, may be pregnant, or are planning to become pregnant.  If you drink alcohol: ? Limit how much you use to 0-1 drink a day. ? Limit intake if you are breastfeeding.  Be aware of how much alcohol is in your drink. In the [place] one drink equals one 12 oz bottle of beer (355 mL), one 5 oz glass of wine (148 mL), or one 1 oz glass of hard liquor (44 mL). General instructions  Schedule regular health, dental, and eye exams.  Stay current with your vaccines.  Tell your health care provider if: ? You often feel depressed. ? You have ever been abused or do not feel safe at home. Summary  Adopting a healthy lifestyle and getting preventive care are important in promoting health and wellness.  Follow your health  care provider's instructions about healthy diet, exercising, and getting tested or screened for diseases.  Follow your health care provider's instructions on monitoring your cholesterol and blood pressure. This information is not intended to replace advice given to you by your health care provider. Make sure you discuss any questions you have with your health care provider. Document Released: 09/01/2010 Document Revised: 02/09/2018 Document Reviewed: 02/09/2018 Elsevier Patient Education  2020 [employer].

## 2019-01-18 LAB — LIPID PANEL
Cholesterol: 219 mg/dL — ABNORMAL HIGH (ref <200)
HDL: 47 mg/dL — ABNORMAL LOW (ref >=50)
LDL Cholesterol (Calc): 153 mg/dL (calc) — ABNORMAL HIGH
Non-HDL Cholesterol (Calc): 172 mg/dL (calc) — ABNORMAL HIGH (ref <130)
Total CHOL/HDL Ratio: 4.7 (calc) (ref <5.0)
Triglycerides: 86 mg/dL (ref <150)

## 2019-01-18 LAB — TSH: TSH: 1.26 mIU/L

## 2019-01-18 LAB — C. TRACHOMATIS/N. GONORRHOEAE RNA
C. trachomatis RNA, TMA: NOT DETECTED
N. gonorrhoeae RNA, TMA: NOT DETECTED

## 2019-01-18 LAB — HIV ANTIBODY (ROUTINE TESTING W REFLEX): HIV 1&2 Ab, 4th Generation: NONREACTIVE

## 2019-01-18 LAB — RPR: RPR Ser Ql: NONREACTIVE

## 2019-01-19 LAB — PAP IG AND HPV HIGH-RISK: HPV DNA High Risk: NOT DETECTED

## 2019-01-20 ENCOUNTER — Other Ambulatory Visit: Payer: Self-pay

## 2019-01-23 ENCOUNTER — Other Ambulatory Visit: Payer: Self-pay

## 2019-01-23 ENCOUNTER — Ambulatory Visit (INDEPENDENT_AMBULATORY_CARE_PROVIDER_SITE_OTHER): Payer: BC Managed Care – PPO

## 2019-01-23 ENCOUNTER — Ambulatory Visit (INDEPENDENT_AMBULATORY_CARE_PROVIDER_SITE_OTHER): Payer: BC Managed Care – PPO | Admitting: Women's Health

## 2019-01-23 ENCOUNTER — Encounter: Payer: Self-pay | Admitting: Women's Health

## 2019-01-23 VITALS — BP 128/80

## 2019-01-23 DIAGNOSIS — N8000 Endometriosis of the uterus, unspecified: Secondary | ICD-10-CM

## 2019-01-23 DIAGNOSIS — N920 Excessive and frequent menstruation with regular cycle: Secondary | ICD-10-CM | POA: Diagnosis not present

## 2019-01-23 DIAGNOSIS — N8003 Adenomyosis of the uterus: Secondary | ICD-10-CM | POA: Insufficient documentation

## 2019-01-23 MED ORDER — TRANEXAMIC ACID 650 MG PO TABS: ORAL_TABLET | ORAL | 12 refills | Status: DC

## 2019-01-23 MED ORDER — IBUPROFEN 600 MG PO TABS: 600.0000 mg | ORAL_TABLET | Freq: Three times a day (TID) | ORAL | 1 refills | Status: DC | PRN

## 2019-01-23 NOTE — Progress Notes (Signed)
38 year old SBF G5, P3 presents for ultrasound.  At annual exam reported menstrual cycles are monthly but with heavier flow and increased cramping.  Denies vaginal discharge, urinary symptoms, abdominal pain or fever.  Not sexually active.  No known medical problems.  Exam: Appears well.  Ultrasound: Anteverted uterus normal shape and size, inhomogenous myometrium with tiny cystic changes suggestive of adenomyosis.  No myometrial masses.  Thin symmetrical endometrium 4 mm.  Very small amount of fluid within the cavity end of menses.  No masses or thickening seen.  Both ovaries normal size with normal follicle pattern.  No adnexal masses.  No free fluid.  Menorrhagia with dysmenorrhea-adenomyosis  Plan: Ultrasound reviewed adenomyosis most likely causing menstrual changes.  Discussion concerning adenomyosis and options of OCs, Mirena IUD, declines at this time.  Will try Lysteda 650 mg 2 tablets 3 times daily with menses.  Prescription, proper use given and reviewed.  Will call if continued problems.

## 2019-01-23 NOTE — Patient Instructions (Signed)
Dysmenorrhea  Dysmenorrhea refers to cramps caused by the muscles of the uterus tightening (contracting) during a menstrual period. Dysmenorrhea may be mild, or it may be severe enough to interfere with everyday activities for a few days each month. Primary dysmenorrhea is menstrual cramps that last a couple of days when you start having menstrual periods or soon after. This often begins after a teenager starts having her period. As a woman gets older or has a baby, the cramps will usually lessen or disappear. Secondary dysmenorrhea begins later in life and is caused by a disorder in the reproductive system. It lasts longer, and it may cause more pain than primary dysmenorrhea. The pain may start before the period and last a few days after the period. What are the causes? Dysmenorrhea is usually caused by an underlying problem, such as:  The tissue that lines the uterus (endometrium) growing outside of the uterus in other areas of the body (endometriosis).  Endometrial tissue growing into the muscular walls of the uterus (adenomyosis).  Blood vessels in the pelvis becoming filled with blood just before the menstrual period (pelvic congestive syndrome).  Overgrowth of cells (polyps) in the endometrium or the lower part of the uterus (cervix).  The uterus dropping down into the vagina (prolapse) due to stretched or weak muscles.  Bladder problems, such as infection or inflammation.  Intestinal problems, such as a tumor or irritable bowel syndrome.  Cancer of the reproductive organs or bladder.  A severely tipped uterus.  A cervix that is closed or has a very small opening.  Noncancerous (benign) tumors of the uterus (fibroids).  Pelvic inflammatory disease (PID).  Pelvic scarring (adhesions) from a previous surgery.  An ovarian cyst.  An IUD (intrauterine device). What increases the risk? You are more likely to develop this condition if:  You are younger than age 30.  You  started puberty early.  You have irregular or heavy bleeding.  You have never given birth.  You have a family history of dysmenorrhea.  You smoke. What are the signs or symptoms? Symptoms of this condition include:  Cramping, throbbing pain, or a feeling of fullness in the lower abdomen.  Lower back pain.  Periods lasting for longer than 7 days.  Headaches.  Bloating.  Fatigue.  Nausea or vomiting.  Diarrhea.  Sweating or dizziness.  Loose stools. How is this diagnosed? This condition may be diagnosed based on:  Your symptoms.  Your medical history.  A physical exam.  Blood tests.  A Pap test. This is a test in which cells from the cervix are tested for signs of cancer or infection.  A pregnancy test.  Imaging tests, such as: ? Ultrasound. ? A procedure to remove and examine a sample of endometrial tissue (dilation and curettage, D&C). ? A procedure to visually examine the inside of:  The uterus (hysteroscopy).  The abdomen or pelvis (laparoscopy).  The bladder (cystoscopy).  The intestine (colonoscopy).  The stomach (gastroscopy). ? X-rays. ? CT scan. ? MRI. How is this treated? Treatment depends on the cause of the dysmenorrhea. Treatment may include:  Pain medicine prescribed by your health care provider.  Birth control pills that contain the hormone progesterone.  An IUD that contains the hormone progesterone.  Medicines to control bleeding.  Hormone replacement therapy.  NSAIDs. These may help to stop the production of hormones that cause cramps.  Antidepressant medicines.  Surgery to remove adhesions, endometriosis, ovarian cysts, fibroids, or the entire uterus (hysterectomy).  Injections of progesterone   to stop the menstrual period.  A procedure to destroy the endometrium (endometrial ablation).  A procedure to cut the nerves in the bottom of the spine (sacrum) that go to the reproductive organs (presacral neurectomy).  A  procedure to apply an electric current to nerves in the sacrum (sacral nerve stimulation).  Exercise and physical therapy.  Meditation and yoga therapy.  Acupuncture. Work with your health care provider to determine what treatment or combination of treatments is best for you. Follow these instructions at home: Relieving pain and cramping  Apply heat to your lower back or abdomen when you experience pain or cramps. Use the heat source that your health care provider recommends, such as a moist heat pack or a heating pad. ? Place a towel between your skin and the heat source. ? Leave the heat on for 20-30 minutes. ? Remove the heat if your skin turns bright red. This is especially important if you are unable to feel pain, heat, or cold. You may have a greater risk of getting burned. ? Do not sleep with a heating pad on.  Do aerobic exercises, such as walking, swimming, or biking. This can help to relieve cramps.  Massage your lower back or abdomen to help relieve pain. General instructions  Take over-the-counter and prescription medicines only as told by your health care provider.  Do not drive or use heavy machinery while taking prescription pain medicine.  Avoid alcohol and caffeine during and right before your menstrual period. These can make cramps worse.  Do not use any products that contain nicotine or tobacco, such as cigarettes and e-cigarettes. If you need help quitting, ask your health care provider.  Keep all follow-up visits as told by your health care provider. This is important. Contact a health care provider if:  You have pain that gets worse or does not get better with medicine.  You have pain with sex.  You develop nausea or vomiting with your period that is not controlled with medicine. Get help right away if:  You faint. Summary  Dysmenorrhea refers to cramps caused by the muscles of the uterus tightening (contracting) during a menstrual period.   Dysmenorrhea may be mild, or it may be severe enough to interfere with everyday activities for a few days each month.  Treatment depends on the cause of the dysmenorrhea.  Work with your health care provider to determine what treatment or combination of treatments is best for you. This information is not intended to replace advice given to you by your health care provider. Make sure you discuss any questions you have with your health care provider. Document Released: 02/16/2005 Document Revised: 01/29/2017 Document Reviewed: 03/21/2016 Elsevier Patient Education  2020 [employer]  

## 2019-01-29 DIAGNOSIS — Z03818 Encounter for observation for suspected exposure to other biological agents ruled out: Secondary | ICD-10-CM | POA: Diagnosis not present

## 2019-03-21 ENCOUNTER — Ambulatory Visit: Payer: BC Managed Care – PPO | Attending: Internal Medicine

## 2019-03-21 DIAGNOSIS — Z20822 Contact with and (suspected) exposure to covid-19: Secondary | ICD-10-CM | POA: Diagnosis not present

## 2019-03-22 LAB — NOVEL CORONAVIRUS, NAA: SARS-CoV-2, NAA: NOT DETECTED

## 2019-06-08 ENCOUNTER — Ambulatory Visit: Payer: BC Managed Care – PPO

## 2019-06-19 DIAGNOSIS — M25562 Pain in left knee: Secondary | ICD-10-CM | POA: Diagnosis not present

## 2019-07-26 ENCOUNTER — Other Ambulatory Visit: Payer: Self-pay | Admitting: *Deleted

## 2019-07-26 MED ORDER — NYSTATIN-TRIAMCINOLONE 100000-0.1 UNIT/GM-% EX OINT: 1.0000 "application " | TOPICAL_OINTMENT | Freq: Two times a day (BID) | CUTANEOUS | 0 refills | Status: DC

## 2019-07-26 NOTE — Telephone Encounter (Addendum)
Annual exam scheduled on 01/23/20.  Per 10/12/18 note "Vaginal irritation with itching at clitoral hood"

## 2019-10-09 ENCOUNTER — Other Ambulatory Visit: Payer: Self-pay | Admitting: Podiatry

## 2019-10-09 ENCOUNTER — Ambulatory Visit (INDEPENDENT_AMBULATORY_CARE_PROVIDER_SITE_OTHER): Payer: BC Managed Care – PPO

## 2019-10-09 ENCOUNTER — Ambulatory Visit: Payer: BC Managed Care – PPO | Admitting: Podiatry

## 2019-10-09 ENCOUNTER — Other Ambulatory Visit: Payer: Self-pay

## 2019-10-09 DIAGNOSIS — M67471 Ganglion, right ankle and foot: Secondary | ICD-10-CM

## 2019-10-09 DIAGNOSIS — M6740 Ganglion, unspecified site: Secondary | ICD-10-CM | POA: Diagnosis not present

## 2019-10-09 DIAGNOSIS — M79671 Pain in right foot: Secondary | ICD-10-CM

## 2019-10-09 DIAGNOSIS — M7989 Other specified soft tissue disorders: Secondary | ICD-10-CM | POA: Diagnosis not present

## 2019-10-10 ENCOUNTER — Other Ambulatory Visit: Payer: Self-pay | Admitting: Podiatry

## 2019-10-10 ENCOUNTER — Telehealth: Payer: Self-pay | Admitting: *Deleted

## 2019-10-10 DIAGNOSIS — M67471 Ganglion, right ankle and foot: Secondary | ICD-10-CM

## 2019-10-10 NOTE — Telephone Encounter (Signed)
[facility] - Renee Clark states they have an order without a specimen.

## 2019-10-10 NOTE — Telephone Encounter (Signed)
I spoke with Dr. Gabriel Rung 10/09/2019 assistant D. Laural Benes, CMA and he states the specimen was placed in the specimen pick up box today around lunchtime.

## 2019-10-10 NOTE — Telephone Encounter (Signed)
I informed [facility] - Corrie Dandy the specimen was placed in the pick up container today and would come to [facility].

## 2019-10-11 LAB — NON-GYN, SPECIMEN A

## 2019-10-11 LAB — CYTOLOGY - NON PAP

## 2019-10-16 NOTE — Progress Notes (Signed)
Subjective:   Patient ID: Renee Clark, female   DOB: 39 y.o.   MRN: [id number]   HPI 39 year old female presents the office today for concerns of a mass on top of her right foot which is been ongoing for about a month.  She denies recent injury or trauma.  Only becomes uncomfortable with pressure.  She has had no recent treatment.  She has no other concerns today.  Review of Systems  All other systems reviewed and are negative.  Past Medical History:  Diagnosis Date  . HSV (herpes simplex virus) infection     Past Surgical History:  Procedure Laterality Date  . HYSTEROSCOPY    . INTRAUTERINE DEVICE INSERTION  2012   paraguard     Current Outpatient Medications:  .  ibuprofen (ADVIL) 600 MG tablet, Take 1 tablet (600 mg total) by mouth every 8 (eight) hours as needed., Disp: 60 tablet, Rfl: 1 .  Multiple Vitamin (MULTIVITAMIN) tablet, Take 1 tablet by mouth daily., Disp: , Rfl:  .  nystatin-triamcinolone ointment (MYCOLOG), Apply 1 application topically 2 (two) times daily., Disp: 30 g, Rfl: 0 .  tranexamic acid (LYSTEDA) 650 MG TABS tablet, Take 2 tablets 3 times a day for 4-5 days with cycle, Disp: 30 tablet, Rfl: 12  No Known Allergies       Objective:  Physical Exam  General: AAO x3, NAD  Dermatological: Skin is warm, dry and supple bilateral. There are no open sores, no preulcerative lesions, no rash or signs of infection present.  Vascular: Dorsalis Pedis artery and Posterior Tibial artery pedal pulses are 2/4 bilateral with immedate capillary fill time. There is no pain with calf compression, swelling, warmth, erythema.   Neruologic: Grossly intact via light touch bilateral.  Musculoskeletal: Fluid-filled mobile soft tissue mass in the dorsal lateral aspect of the right midfoot.  Appear to be a ganglion cyst.  No significant tenderness palpation.  No other areas of discomfort identified.  Muscular strength 5/5 in all groups tested bilateral.  Gait: Unassisted,  Nonantalgic.       Assessment:   Right foot soft tissue mass, likely ganglion cyst     Plan:  -Treatment options discussed including all alternatives, risks, and complications -Etiology of symptoms were discussed -X-rays were obtained and reviewed with the patient.  No calcifications or bone spur present.  Evidence of acute fracture. -Discussed with her aspiration versus surgical excision or watching.  She elects proceed with aspiration.  Skin was cleaned with alcohol and a mixture of 3 cc of lidocaine, Marcaine plain was infiltrated in a regional block fashion.  Once anesthetized the skin was prepped with Betadine and an 18-gauge needle was utilized to aspirate clear, gel-like fluid consistent with a ganglion cyst.  Quarter cc Marcaine plain and quarter cc Kenalog 10 was infiltrated into the mass.  Compression bandage applied.  Post procedure instructions discussed.  Monitor for any signs or symptoms of infection.  Patient tolerated well. -Fluid sent to pathology   Vivi Barrack DPM

## 2019-12-01 DIAGNOSIS — Z20822 Contact with and (suspected) exposure to covid-19: Secondary | ICD-10-CM | POA: Diagnosis not present

## 2020-01-22 ENCOUNTER — Encounter: Payer: BC Managed Care – PPO | Admitting: Women's Health

## 2020-01-23 ENCOUNTER — Encounter: Payer: BC Managed Care – PPO | Admitting: Nurse Practitioner

## 2020-02-07 ENCOUNTER — Other Ambulatory Visit: Payer: Self-pay

## 2020-02-07 ENCOUNTER — Ambulatory Visit
Admission: RE | Admit: 2020-02-07 | Discharge: 2020-02-07 | Disposition: A | Discharge status: Home / Self Care | Payer: BC Managed Care – PPO | Source: Ambulatory Visit

## 2020-02-07 DIAGNOSIS — Z1231 Encounter for screening mammogram for malignant neoplasm of breast: Secondary | ICD-10-CM

## 2020-03-22 ENCOUNTER — Ambulatory Visit: Payer: BC Managed Care – PPO

## 2020-03-22 ENCOUNTER — Ambulatory Visit
Admission: RE | Admit: 2020-03-22 | Discharge: 2020-03-22 | Disposition: A | Discharge status: Home / Self Care | Payer: BC Managed Care – PPO | Source: Ambulatory Visit

## 2020-03-22 DIAGNOSIS — Z1231 Encounter for screening mammogram for malignant neoplasm of breast: Secondary | ICD-10-CM | POA: Diagnosis not present

## 2020-05-07 DIAGNOSIS — F900 Attention-deficit hyperactivity disorder, predominantly inattentive type: Secondary | ICD-10-CM | POA: Diagnosis not present

## 2020-05-07 DIAGNOSIS — F411 Generalized anxiety disorder: Secondary | ICD-10-CM | POA: Diagnosis not present

## 2020-06-22 DIAGNOSIS — R590 Localized enlarged lymph nodes: Secondary | ICD-10-CM | POA: Diagnosis not present

## 2020-06-22 DIAGNOSIS — Z20818 Contact with and (suspected) exposure to other bacterial communicable diseases: Secondary | ICD-10-CM | POA: Diagnosis not present

## 2020-06-26 DIAGNOSIS — F4310 Post-traumatic stress disorder, unspecified: Secondary | ICD-10-CM | POA: Diagnosis not present

## 2020-06-26 DIAGNOSIS — F900 Attention-deficit hyperactivity disorder, predominantly inattentive type: Secondary | ICD-10-CM | POA: Diagnosis not present

## 2020-09-17 DIAGNOSIS — Z20822 Contact with and (suspected) exposure to covid-19: Secondary | ICD-10-CM | POA: Diagnosis not present

## 2020-09-20 IMAGING — CR DG CHEST 2V
2 series · 2 of 2 positions shown · non-contrast
Comparison: None.

CLINICAL DATA: Left-sided chest pain for 1 day.

EXAM:
CHEST - 2 VIEW

[chest pa]
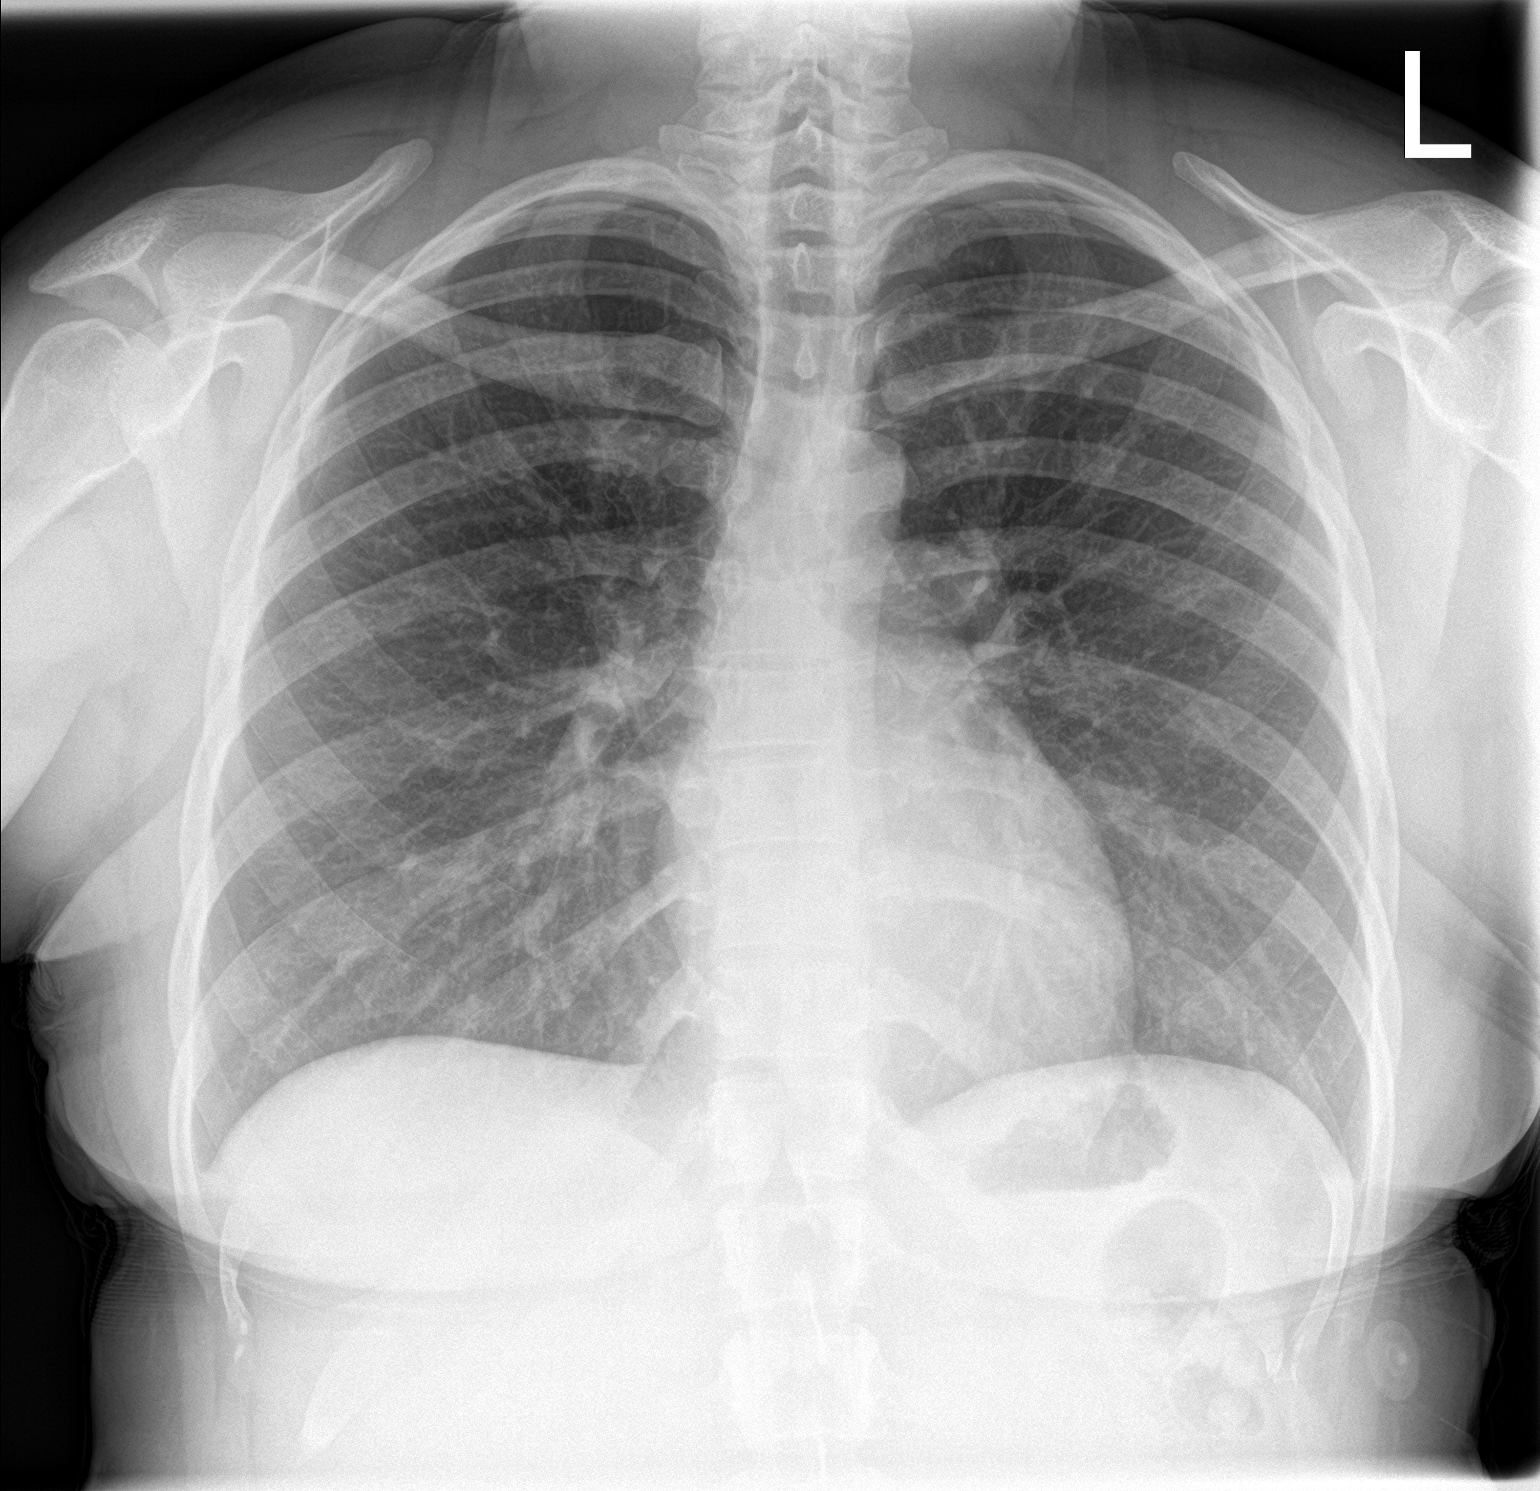

[chest lat]
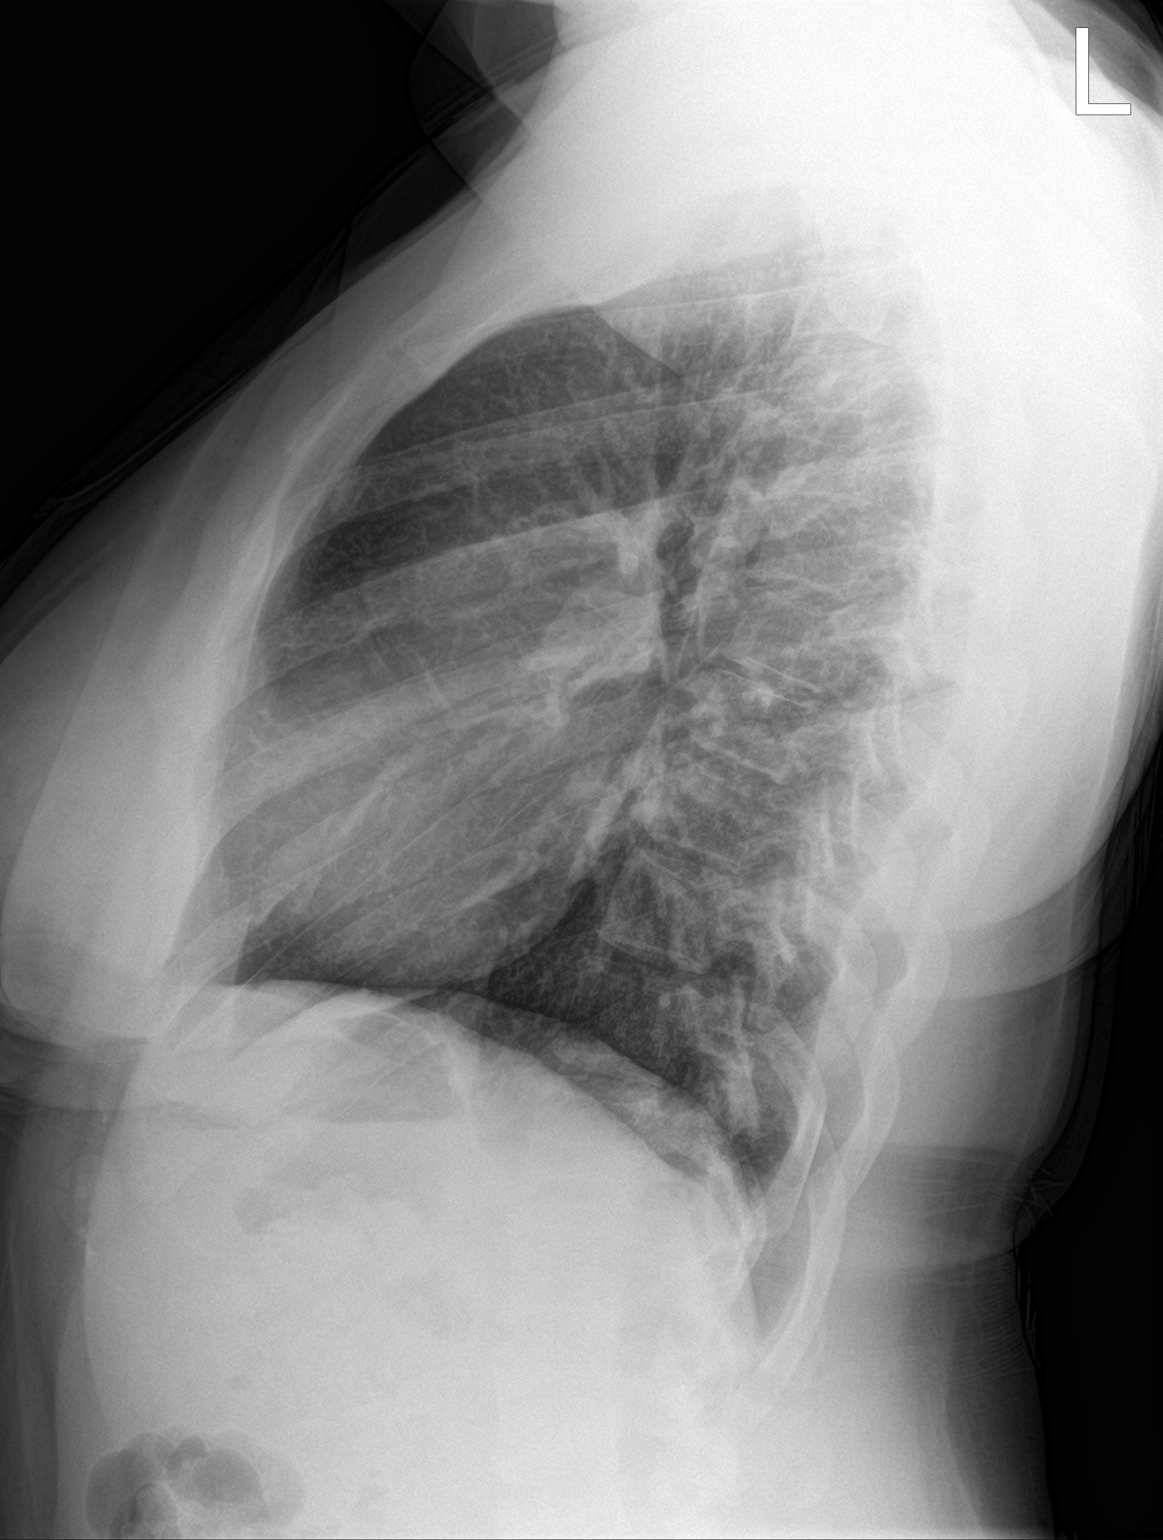

[2 of 2 positions shown; findings below may reference images not displayed]

FINDINGS: The heart size and mediastinal contours are within normal limits.
Both lungs are clear. No evidence of pneumothorax or pleural
effusion. The visualized skeletal structures are unremarkable.
IMPRESSION: Normal study.

## 2020-10-31 DIAGNOSIS — G4710 Hypersomnia, unspecified: Secondary | ICD-10-CM | POA: Diagnosis not present

## 2020-11-01 DIAGNOSIS — G4710 Hypersomnia, unspecified: Secondary | ICD-10-CM | POA: Diagnosis not present

## 2020-11-07 DIAGNOSIS — G4733 Obstructive sleep apnea (adult) (pediatric): Secondary | ICD-10-CM | POA: Diagnosis not present

## 2020-11-25 DIAGNOSIS — F3342 Major depressive disorder, recurrent, in full remission: Secondary | ICD-10-CM | POA: Diagnosis not present

## 2020-11-25 DIAGNOSIS — F900 Attention-deficit hyperactivity disorder, predominantly inattentive type: Secondary | ICD-10-CM | POA: Diagnosis not present

## 2020-11-25 DIAGNOSIS — F411 Generalized anxiety disorder: Secondary | ICD-10-CM | POA: Diagnosis not present

## 2020-11-26 DIAGNOSIS — G4733 Obstructive sleep apnea (adult) (pediatric): Secondary | ICD-10-CM | POA: Diagnosis not present

## 2020-12-25 DIAGNOSIS — G4733 Obstructive sleep apnea (adult) (pediatric): Secondary | ICD-10-CM | POA: Diagnosis not present

## 2020-12-25 DIAGNOSIS — H6993 Unspecified Eustachian tube disorder, bilateral: Secondary | ICD-10-CM | POA: Diagnosis not present

## 2021-06-04 ENCOUNTER — Other Ambulatory Visit: Payer: Self-pay | Admitting: *Deleted

## 2021-06-04 DIAGNOSIS — Z1231 Encounter for screening mammogram for malignant neoplasm of breast: Secondary | ICD-10-CM

## 2021-06-16 DIAGNOSIS — Z1231 Encounter for screening mammogram for malignant neoplasm of breast: Secondary | ICD-10-CM

## 2021-07-29 ENCOUNTER — Other Ambulatory Visit: Payer: Self-pay | Admitting: *Deleted

## 2021-07-29 DIAGNOSIS — Z1231 Encounter for screening mammogram for malignant neoplasm of breast: Secondary | ICD-10-CM

## 2021-07-31 ENCOUNTER — Ambulatory Visit
Admission: RE | Admit: 2021-07-31 | Discharge: 2021-07-31 | Disposition: A | Discharge status: Home / Self Care | Payer: BC Managed Care – PPO | Source: Ambulatory Visit

## 2021-07-31 DIAGNOSIS — Z1231 Encounter for screening mammogram for malignant neoplasm of breast: Secondary | ICD-10-CM

## 2021-08-05 ENCOUNTER — Other Ambulatory Visit: Payer: Self-pay | Admitting: *Deleted

## 2021-08-05 DIAGNOSIS — R928 Other abnormal and inconclusive findings on diagnostic imaging of breast: Secondary | ICD-10-CM

## 2021-08-13 ENCOUNTER — Other Ambulatory Visit: Payer: Self-pay | Admitting: *Deleted

## 2021-08-13 ENCOUNTER — Ambulatory Visit
Admission: RE | Admit: 2021-08-13 | Discharge: 2021-08-13 | Disposition: A | Discharge status: Home / Self Care | Payer: BC Managed Care – PPO | Source: Ambulatory Visit | Attending: *Deleted | Admitting: *Deleted

## 2021-08-13 DIAGNOSIS — R928 Other abnormal and inconclusive findings on diagnostic imaging of breast: Secondary | ICD-10-CM

## 2021-08-13 DIAGNOSIS — N6320 Unspecified lump in the left breast, unspecified quadrant: Secondary | ICD-10-CM

## 2021-08-14 ENCOUNTER — Ambulatory Visit
Admission: RE | Admit: 2021-08-14 | Discharge: 2021-08-14 | Disposition: A | Discharge status: Home / Self Care | Payer: BC Managed Care – PPO | Source: Ambulatory Visit | Attending: *Deleted | Admitting: *Deleted

## 2021-08-14 DIAGNOSIS — N6320 Unspecified lump in the left breast, unspecified quadrant: Secondary | ICD-10-CM

## 2021-09-18 DIAGNOSIS — M47816 Spondylosis without myelopathy or radiculopathy, lumbar region: Secondary | ICD-10-CM | POA: Diagnosis not present

## 2021-12-08 DIAGNOSIS — M47816 Spondylosis without myelopathy or radiculopathy, lumbar region: Secondary | ICD-10-CM | POA: Diagnosis not present

## 2021-12-08 DIAGNOSIS — M25551 Pain in right hip: Secondary | ICD-10-CM | POA: Diagnosis not present

## 2022-01-26 DIAGNOSIS — M47816 Spondylosis without myelopathy or radiculopathy, lumbar region: Secondary | ICD-10-CM | POA: Diagnosis not present

## 2022-02-12 DIAGNOSIS — M25551 Pain in right hip: Secondary | ICD-10-CM | POA: Diagnosis not present

## 2022-02-25 DIAGNOSIS — F330 Major depressive disorder, recurrent, mild: Secondary | ICD-10-CM | POA: Diagnosis not present

## 2022-02-25 DIAGNOSIS — F411 Generalized anxiety disorder: Secondary | ICD-10-CM | POA: Diagnosis not present

## 2022-02-25 DIAGNOSIS — F902 Attention-deficit hyperactivity disorder, combined type: Secondary | ICD-10-CM | POA: Diagnosis not present

## 2022-05-19 DIAGNOSIS — M47816 Spondylosis without myelopathy or radiculopathy, lumbar region: Secondary | ICD-10-CM | POA: Diagnosis not present

## 2022-06-04 DIAGNOSIS — M47816 Spondylosis without myelopathy or radiculopathy, lumbar region: Secondary | ICD-10-CM | POA: Diagnosis not present

## 2022-06-29 DIAGNOSIS — F411 Generalized anxiety disorder: Secondary | ICD-10-CM | POA: Diagnosis not present

## 2022-06-29 DIAGNOSIS — F330 Major depressive disorder, recurrent, mild: Secondary | ICD-10-CM | POA: Diagnosis not present

## 2022-06-29 DIAGNOSIS — F902 Attention-deficit hyperactivity disorder, combined type: Secondary | ICD-10-CM | POA: Diagnosis not present

## 2022-07-23 DIAGNOSIS — F330 Major depressive disorder, recurrent, mild: Secondary | ICD-10-CM | POA: Diagnosis not present

## 2022-07-23 DIAGNOSIS — F902 Attention-deficit hyperactivity disorder, combined type: Secondary | ICD-10-CM | POA: Diagnosis not present

## 2022-07-23 DIAGNOSIS — F411 Generalized anxiety disorder: Secondary | ICD-10-CM | POA: Diagnosis not present

## 2022-07-29 DIAGNOSIS — F902 Attention-deficit hyperactivity disorder, combined type: Secondary | ICD-10-CM | POA: Diagnosis not present

## 2022-07-29 DIAGNOSIS — F411 Generalized anxiety disorder: Secondary | ICD-10-CM | POA: Diagnosis not present

## 2022-07-29 DIAGNOSIS — F330 Major depressive disorder, recurrent, mild: Secondary | ICD-10-CM | POA: Diagnosis not present

## 2022-08-18 ENCOUNTER — Ambulatory Visit
Admission: RE | Admit: 2022-08-18 | Discharge: 2022-08-18 | Disposition: A | Discharge status: Home / Self Care | Payer: Medicaid Other | Source: Ambulatory Visit

## 2022-08-18 ENCOUNTER — Other Ambulatory Visit: Payer: Self-pay | Admitting: *Deleted

## 2022-08-18 DIAGNOSIS — Z1231 Encounter for screening mammogram for malignant neoplasm of breast: Secondary | ICD-10-CM | POA: Diagnosis not present

## 2022-12-23 DIAGNOSIS — F900 Attention-deficit hyperactivity disorder, predominantly inattentive type: Secondary | ICD-10-CM | POA: Diagnosis not present

## 2022-12-23 DIAGNOSIS — F411 Generalized anxiety disorder: Secondary | ICD-10-CM | POA: Diagnosis not present

## 2022-12-23 DIAGNOSIS — F330 Major depressive disorder, recurrent, mild: Secondary | ICD-10-CM | POA: Diagnosis not present

## 2023-01-27 DIAGNOSIS — F900 Attention-deficit hyperactivity disorder, predominantly inattentive type: Secondary | ICD-10-CM | POA: Diagnosis not present

## 2023-01-27 DIAGNOSIS — F330 Major depressive disorder, recurrent, mild: Secondary | ICD-10-CM | POA: Diagnosis not present

## 2023-01-27 DIAGNOSIS — F411 Generalized anxiety disorder: Secondary | ICD-10-CM | POA: Diagnosis not present

## 2023-02-09 DIAGNOSIS — M545 Low back pain, unspecified: Secondary | ICD-10-CM | POA: Diagnosis not present

## 2023-02-09 DIAGNOSIS — M25551 Pain in right hip: Secondary | ICD-10-CM | POA: Diagnosis not present

## 2023-02-09 DIAGNOSIS — M542 Cervicalgia: Secondary | ICD-10-CM | POA: Diagnosis not present

## 2023-03-01 DIAGNOSIS — S8391XA Sprain of unspecified site of right knee, initial encounter: Secondary | ICD-10-CM | POA: Diagnosis not present

## 2023-03-01 DIAGNOSIS — M25461 Effusion, right knee: Secondary | ICD-10-CM | POA: Diagnosis not present

## 2023-03-11 DIAGNOSIS — M5441 Lumbago with sciatica, right side: Secondary | ICD-10-CM | POA: Diagnosis not present

## 2023-03-17 DIAGNOSIS — M25551 Pain in right hip: Secondary | ICD-10-CM | POA: Diagnosis not present

## 2023-03-17 DIAGNOSIS — M5127 Other intervertebral disc displacement, lumbosacral region: Secondary | ICD-10-CM | POA: Diagnosis not present

## 2023-03-22 DIAGNOSIS — M5416 Radiculopathy, lumbar region: Secondary | ICD-10-CM | POA: Diagnosis not present

## 2023-03-23 DIAGNOSIS — M794 Hypertrophy of (infrapatellar) fat pad: Secondary | ICD-10-CM | POA: Diagnosis not present

## 2023-03-23 DIAGNOSIS — M5031 Other cervical disc degeneration,  high cervical region: Secondary | ICD-10-CM | POA: Diagnosis not present

## 2023-03-23 DIAGNOSIS — M7121 Synovial cyst of popliteal space [Baker], right knee: Secondary | ICD-10-CM | POA: Diagnosis not present

## 2023-03-23 DIAGNOSIS — S83231A Complex tear of medial meniscus, current injury, right knee, initial encounter: Secondary | ICD-10-CM | POA: Diagnosis not present

## 2023-03-23 DIAGNOSIS — M50323 Other cervical disc degeneration at C6-C7 level: Secondary | ICD-10-CM | POA: Diagnosis not present

## 2023-03-23 DIAGNOSIS — M67951 Unspecified disorder of synovium and tendon, right thigh: Secondary | ICD-10-CM | POA: Diagnosis not present

## 2023-03-23 DIAGNOSIS — M65961 Unspecified synovitis and tenosynovitis, right lower leg: Secondary | ICD-10-CM | POA: Diagnosis not present

## 2023-03-23 DIAGNOSIS — M50322 Other cervical disc degeneration at C5-C6 level: Secondary | ICD-10-CM | POA: Diagnosis not present

## 2023-03-23 DIAGNOSIS — M50321 Other cervical disc degeneration at C4-C5 level: Secondary | ICD-10-CM | POA: Diagnosis not present

## 2023-03-23 DIAGNOSIS — M4802 Spinal stenosis, cervical region: Secondary | ICD-10-CM | POA: Diagnosis not present

## 2023-03-23 DIAGNOSIS — M2241 Chondromalacia patellae, right knee: Secondary | ICD-10-CM | POA: Diagnosis not present

## 2023-03-29 DIAGNOSIS — M5416 Radiculopathy, lumbar region: Secondary | ICD-10-CM | POA: Diagnosis not present

## 2023-04-06 DIAGNOSIS — S83241A Other tear of medial meniscus, current injury, right knee, initial encounter: Secondary | ICD-10-CM | POA: Diagnosis not present

## 2023-04-06 DIAGNOSIS — M25462 Effusion, left knee: Secondary | ICD-10-CM | POA: Diagnosis not present

## 2023-04-16 DIAGNOSIS — F411 Generalized anxiety disorder: Secondary | ICD-10-CM | POA: Diagnosis not present

## 2023-04-16 DIAGNOSIS — F900 Attention-deficit hyperactivity disorder, predominantly inattentive type: Secondary | ICD-10-CM | POA: Diagnosis not present

## 2023-04-16 DIAGNOSIS — F330 Major depressive disorder, recurrent, mild: Secondary | ICD-10-CM | POA: Diagnosis not present

## 2023-04-21 DIAGNOSIS — M5416 Radiculopathy, lumbar region: Secondary | ICD-10-CM | POA: Diagnosis not present

## 2023-05-03 DIAGNOSIS — M5416 Radiculopathy, lumbar region: Secondary | ICD-10-CM | POA: Diagnosis not present

## 2023-05-12 DIAGNOSIS — F411 Generalized anxiety disorder: Secondary | ICD-10-CM | POA: Diagnosis not present

## 2023-05-12 DIAGNOSIS — F900 Attention-deficit hyperactivity disorder, predominantly inattentive type: Secondary | ICD-10-CM | POA: Diagnosis not present

## 2023-05-12 DIAGNOSIS — F330 Major depressive disorder, recurrent, mild: Secondary | ICD-10-CM | POA: Diagnosis not present

## 2023-05-13 ENCOUNTER — Other Ambulatory Visit: Payer: Self-pay | Admitting: Orthopedic Surgery

## 2023-05-30 IMAGING — MG MM BREAST LOCALIZATION CLIP
4 series · 4 of 12 positions shown · non-contrast
Comparison: Previous exam(s).

CLINICAL DATA: Post procedure mammogram for clip placement

EXAM:
3D DIAGNOSTIC LEFT MAMMOGRAM POST ULTRASOUND BIOPSY

[L CC synth-2D]
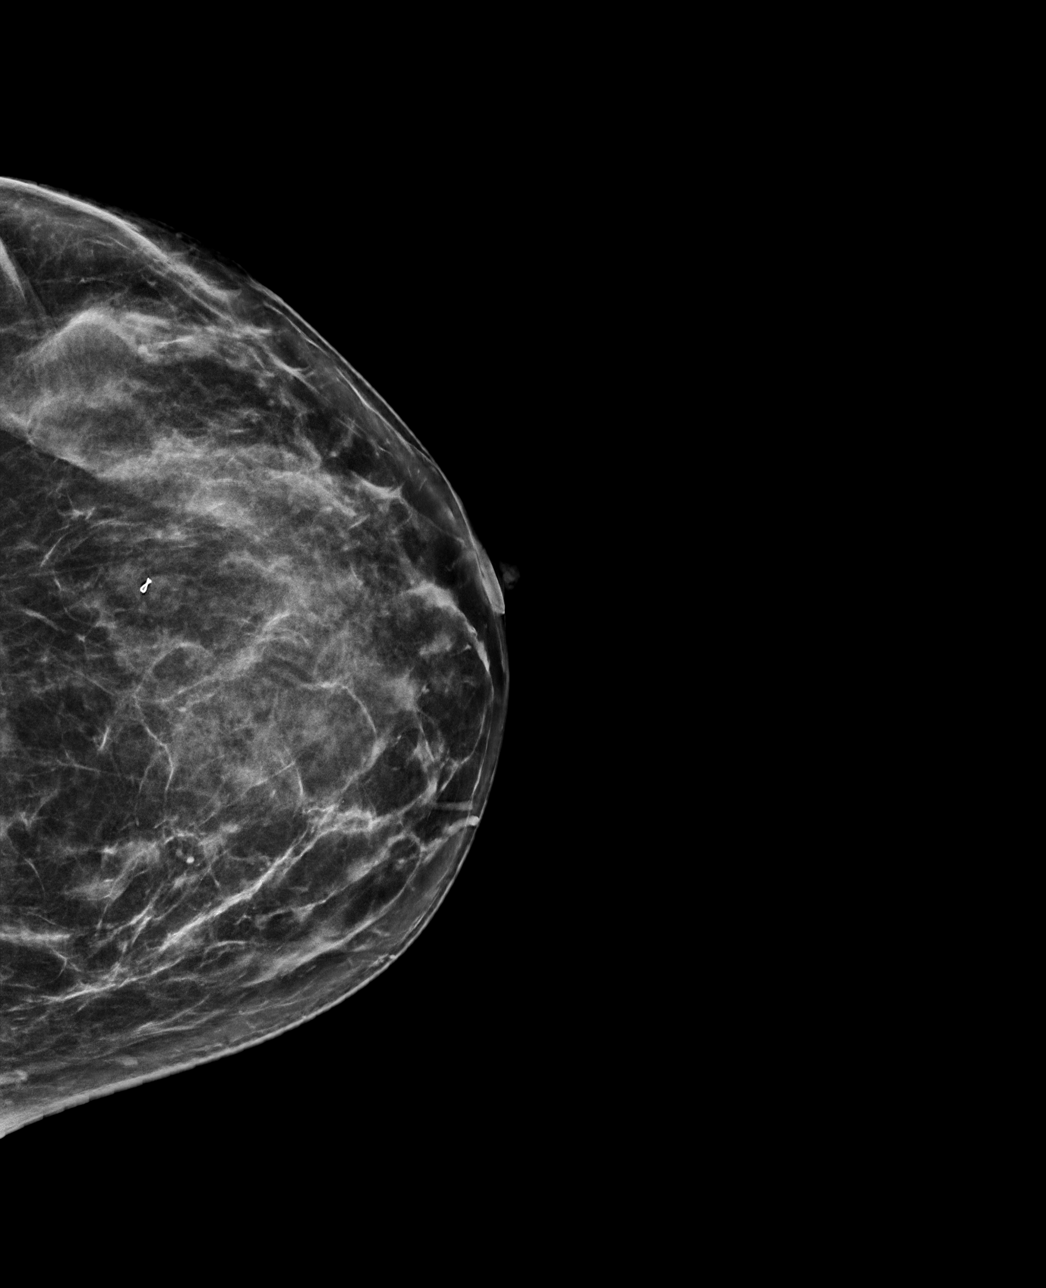

[L ML synth-2D]
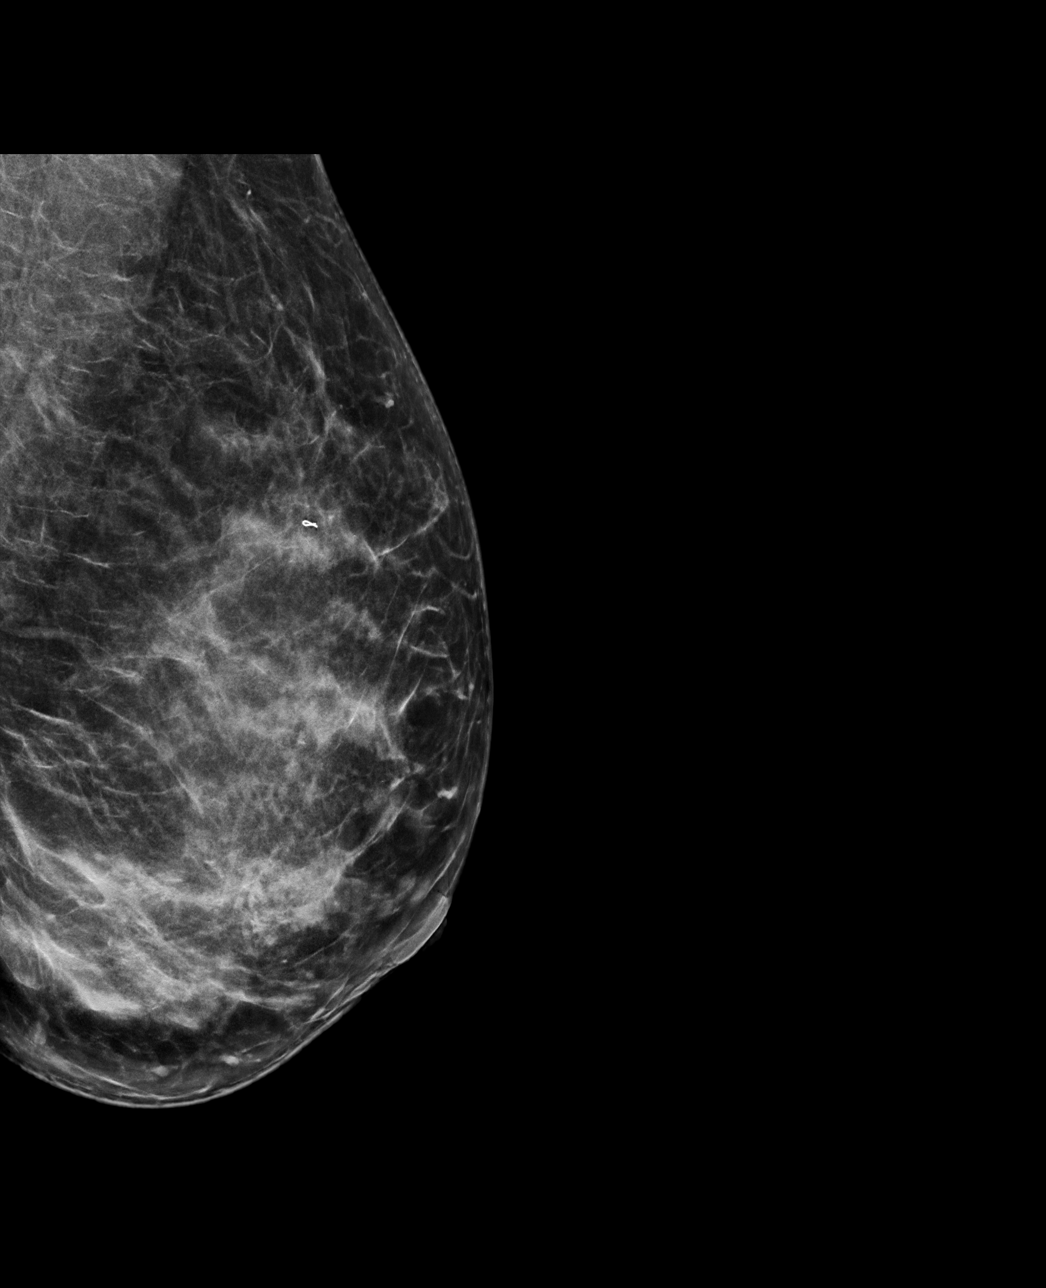

[L ML tomo · tomo slice 37/73.0]
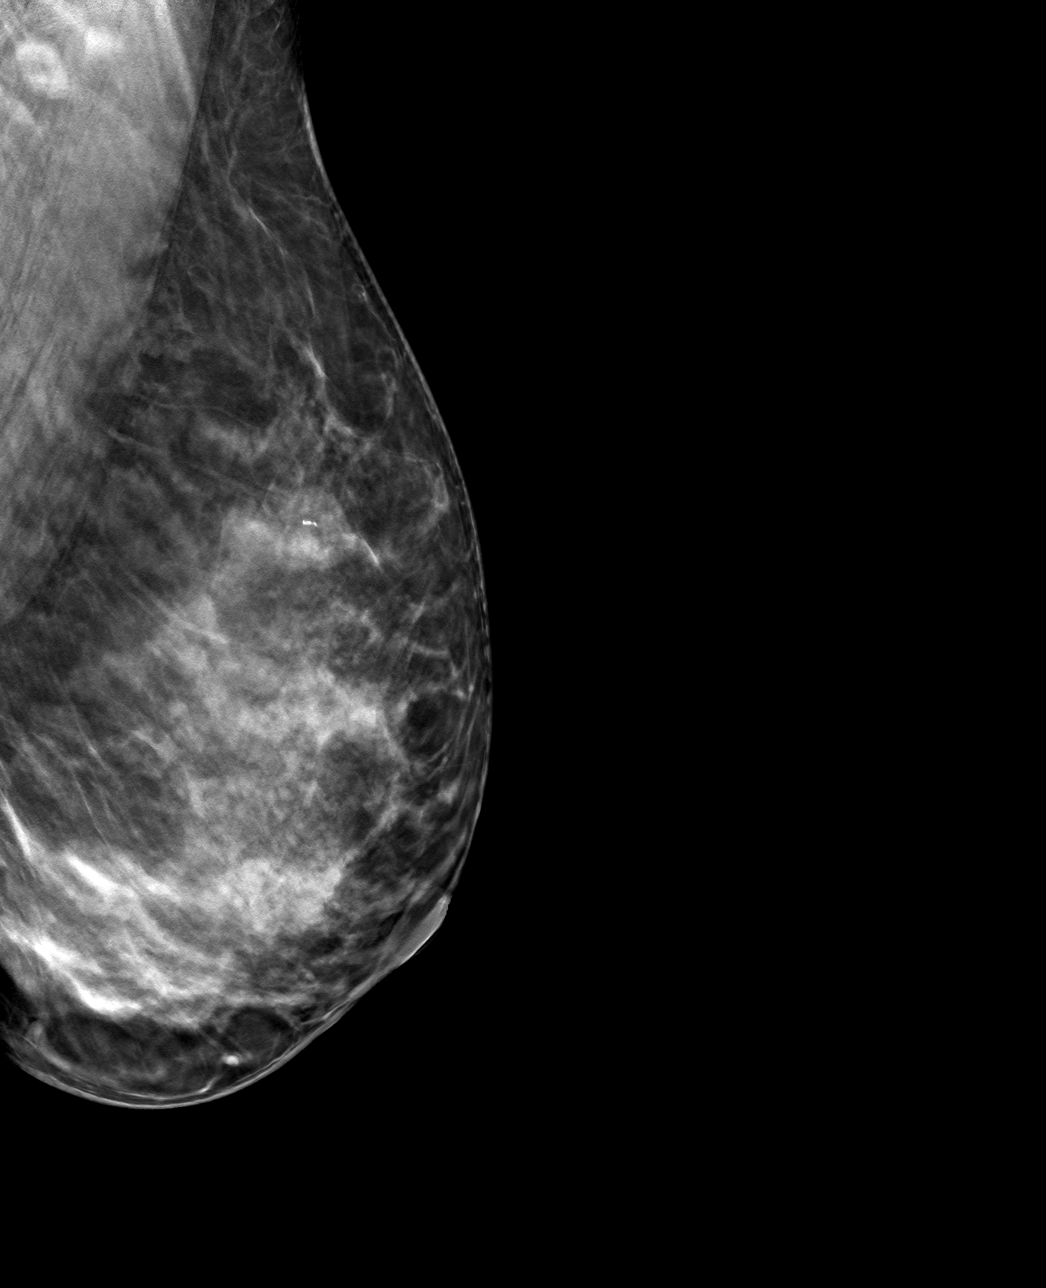

[L CC tomo · tomo slice 36/71.0]
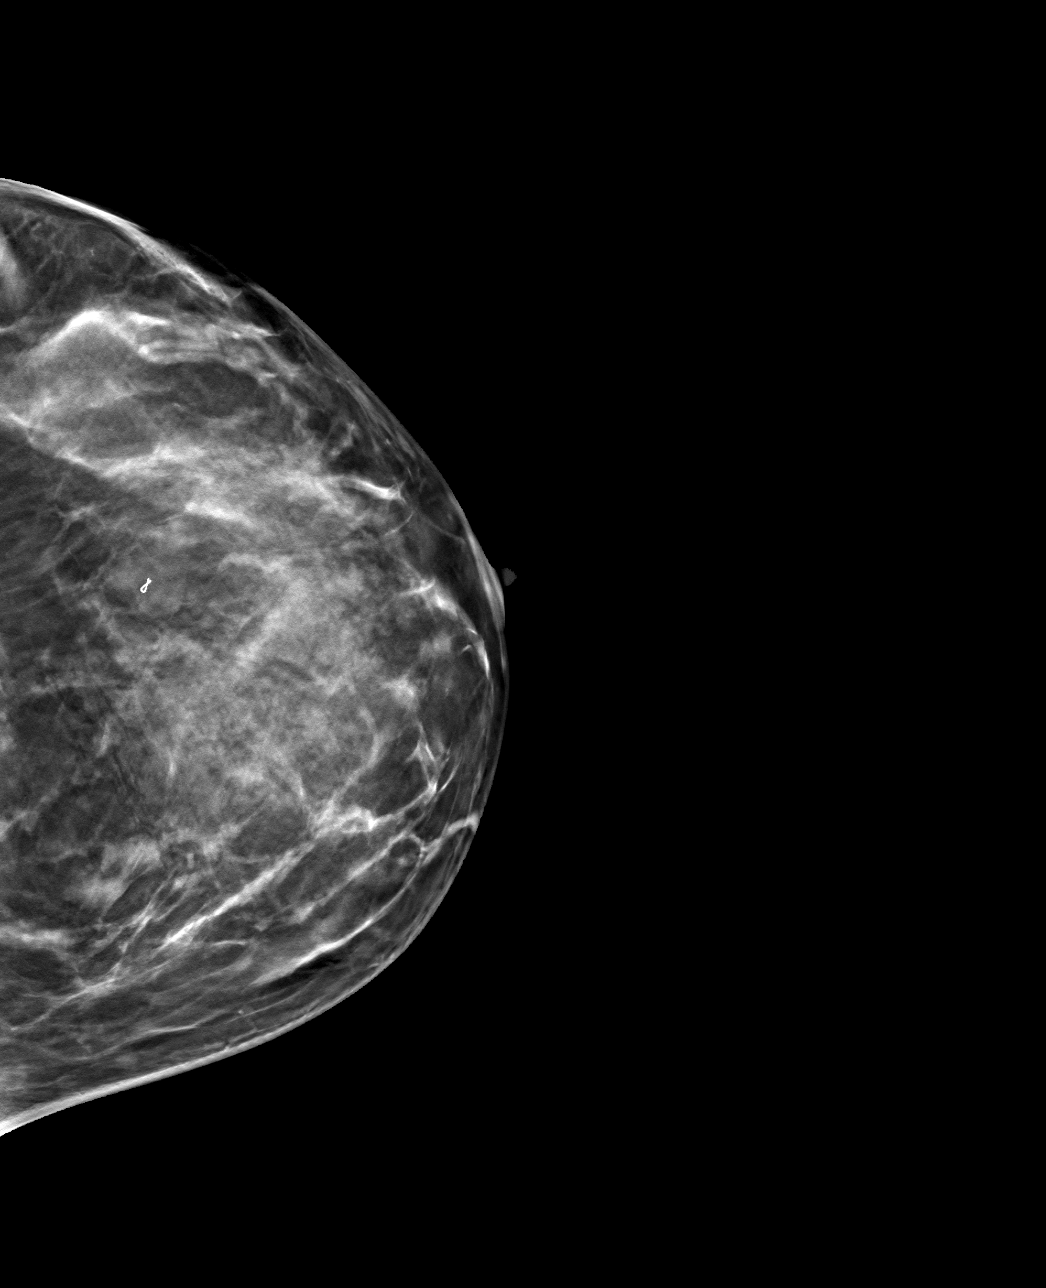

[4 of 12 positions shown; findings below may reference images not displayed]

FINDINGS: 3D Mammographic images were obtained following ultrasound guided
biopsy of a mass in the left breast at 12 o'clock. The biopsy
marking clip is in expected position at the site of biopsy.
IMPRESSION: Appropriate positioning of the ribbon shaped biopsy marking clip at
the site of biopsy in the left breast at 12 o'clock.

Final Assessment: Post Procedure Mammograms for Marker Placement

## 2023-06-16 ENCOUNTER — Encounter (HOSPITAL_COMMUNITY)
Admission: RE | Admit: 2023-06-16 | Discharge: 2023-06-16 | Disposition: A | Discharge status: Home / Self Care | Source: Ambulatory Visit | Attending: Orthopedic Surgery | Admitting: Orthopedic Surgery

## 2023-06-16 ENCOUNTER — Encounter (HOSPITAL_COMMUNITY): Payer: Self-pay

## 2023-06-16 ENCOUNTER — Other Ambulatory Visit: Payer: Self-pay

## 2023-06-16 VITALS — BP 136/89 | HR 84 | Temp 98.4°F | Resp 18 | Ht 69.0 in | Wt 199.3 lb

## 2023-06-16 DIAGNOSIS — Z01812 Encounter for preprocedural laboratory examination: Secondary | ICD-10-CM | POA: Diagnosis not present

## 2023-06-16 DIAGNOSIS — Z01818 Encounter for other preprocedural examination: Secondary | ICD-10-CM

## 2023-06-16 LAB — BASIC METABOLIC PANEL WITH GFR
Anion gap: 8 (ref 5–15)
BUN: 9 mg/dL (ref 6–20)
CO2: 26 mmol/L (ref 22–32)
Calcium: 8.8 mg/dL — ABNORMAL LOW (ref 8.9–10.3)
Chloride: 103 mmol/L (ref 98–111)
Creatinine, Ser: 0.68 mg/dL (ref 0.44–1.00)
GFR, Estimated: >60 mL/min (ref >60)
Glucose, Bld: 93 mg/dL (ref 70–99)
Potassium: 4 mmol/L (ref 3.5–5.1)
Sodium: 137 mmol/L (ref 135–145)

## 2023-06-16 LAB — CBC
HCT: 39.4 % (ref 36.0–46.0)
Hemoglobin: 13.1 g/dL (ref 12.0–15.0)
MCH: 30.7 pg (ref 26.0–34.0)
MCHC: 33.2 g/dL (ref 30.0–36.0)
MCV: 92.3 fL (ref 80.0–100.0)
Platelets: 287 10*3/uL (ref 150–400)
RBC: 4.27 MIL/uL (ref 3.87–5.11)
RDW: 13 % (ref 11.5–15.5)
WBC: 3.9 10*3/uL — ABNORMAL LOW (ref 4.0–10.5)
nRBC: 0 % (ref 0.0–0.2)

## 2023-06-16 LAB — SURGICAL PCR SCREEN
MRSA, PCR: NEGATIVE
Staphylococcus aureus: NEGATIVE

## 2023-06-16 NOTE — Progress Notes (Signed)
 PCP - Does not currently have one Cardiologist - Denies  PPM/ICD - Denies Device Orders - n/a Rep Notified - n/a  Chest x-ray - n/a EKG - n/a Stress Test - Denies ECHO - Denies Cardiac Cath - Denies  Sleep Study - Yes an at home sleep study quite a few years ago, not diagnosed w/OSA but does have a mouth guard to use.  CPAP - Denies  Fasting Blood Sugar - Denies Checks Blood Sugar _____ times a day. N/A  Last dose of GLP1 agonist-  Denies GLP1 instructions: N/A  Blood Thinner Instructions:Denies Aspirin Instructions:Denies  ERAS Protcol - Clears until 0800 PRE-SURGERY Ensure or G2- Ensure  COVID TEST- N/A   Anesthesia review: No  Patient denies shortness of breath, fever, cough and chest pain at PAT appointment. Patient denies any respiratory issues at this time.    All instructions explained to the patient, with a verbal understanding of the material. Patient agrees to go over the instructions while at home for a better understanding. Patient also instructed to self quarantine after being tested for COVID-19. The opportunity to ask questions was provided.

## 2023-06-16 NOTE — Progress Notes (Addendum)
 Surgical Instructions   Your procedure is scheduled on Wednesday, April 30th Report to [facility] Main Entrance "A" at 8:00 A.M., then check in with the Admitting office. Any questions or running late day of surgery: call [phone]  Questions prior to your surgery date: call [phone], Monday-Friday, 8am-4pm. If you experience any cold or flu symptoms such as cough, fever, chills, shortness of breath, etc. between now and your scheduled surgery, please notify us at the above number.     Remember:  Do not eat after midnight the night before your surgery  You may drink clear liquids until 8:00 the morning of your surgery.   Clear liquids allowed are: Water, Non-Citrus Juices (without pulp), Carbonated Beverages, Clear Tea (no milk, honey, etc.), Black Coffee Only (NO MILK, CREAM OR POWDERED CREAMER of any kind), and Gatorade.  Patient Instructions  The night before surgery:  No food after midnight. ONLY clear liquids after midnight  The day of surgery (if you do NOT have diabetes):  Drink ONE (1) Pre-Surgery Clear Ensure by 8:00 the morning of surgery. Drink in one sitting. Do not sip.  This drink was given to you during your hospital pre-op appointment visit. Nothing else to drink after completing the Pre-Surgery Clear Ensure.         If you have questions, please contact your surgeon's office.     Take these medicines the morning of surgery with A SIP OF WATER  cyclobenzaprine (FLEXERIL)  escitalopram (LEXAPRO)   One week prior to surgery, STOP taking any Aspirin (unless otherwise instructed by your surgeon) Aleve, Naproxen, Ibuprofen, Motrin, Advil, Goody's, BC's, all herbal medications, fish oil, and non-prescription vitamins. This includes your meloxicam (MOBIC).                     Do NOT Smoke (Tobacco/Vaping) for 24 hours prior to your procedure.  If you use a CPAP at night, you may bring your mask/headgear for your overnight stay.   You will be asked to remove  any contacts, glasses, piercing's, hearing aid's, dentures/partials prior to surgery. Please bring cases for these items if needed.    Patients discharged the day of surgery will not be allowed to drive home, and someone needs to stay with them for 24 hours.  SURGICAL WAITING ROOM VISITATION Patients may have no more than 2 support people in the waiting area - these visitors may rotate.   Pre-op nurse will coordinate an appropriate time for 1 ADULT support person, who may not rotate, to accompany patient in pre-op.  Children under the age of 36 must have an adult with them who is not the patient and must remain in the main waiting area with an adult.  If the patient needs to stay at the hospital during part of their recovery, the visitor guidelines for inpatient rooms apply.  Please refer to the [facility] website for the visitor guidelines for any additional information.   If you received a COVID test during your pre-op visit  it is requested that you wear a mask when out in public, stay away from anyone that may not be feeling well and notify your surgeon if you develop symptoms. If you have been in contact with anyone that has tested positive in the last 10 days please notify you surgeon.      Pre-operative 5 CHG Bathing Instructions   You can play a key role in reducing the risk of infection after surgery. Your skin needs to be as  free of germs as possible. You can reduce the number of germs on your skin by washing with CHG (chlorhexidine gluconate) soap before surgery. CHG is an antiseptic soap that kills germs and continues to kill germs even after washing.   DO NOT use if you have an allergy to chlorhexidine/CHG or antibacterial soaps. If your skin becomes reddened or irritated, stop using the CHG and notify one of our RNs at [phone].   Please shower with the CHG soap starting 4 days before surgery using the following schedule:     Please keep in mind the following:  DO NOT  shave, including legs and underarms, starting the day of your first shower.   You may shave your face at any point before/day of surgery.  Place clean sheets on your bed the day you start using CHG soap. Use a clean washcloth (not used since being washed) for each shower. DO NOT sleep with pets once you start using the CHG.   CHG Shower Instructions:  Wash your face and private area with normal soap. If you choose to wash your hair, wash first with your normal shampoo.  After you use shampoo/soap, rinse your hair and body thoroughly to remove shampoo/soap residue.  Turn the water OFF and apply about 3 tablespoons (45 ml) of CHG soap to a CLEAN washcloth.  Apply CHG soap ONLY FROM YOUR NECK DOWN TO YOUR TOES (washing for 3-5 minutes)  DO NOT use CHG soap on face, private areas, open wounds, or sores.  Pay special attention to the area where your surgery is being performed.  If you are having back surgery, having someone wash your back for you may be helpful. Wait 2 minutes after CHG soap is applied, then you may rinse off the CHG soap.  Pat dry with a clean towel  Put on clean clothes/pajamas   If you choose to wear lotion, please use ONLY the CHG-compatible lotions that are listed below.  Additional instructions for the day of surgery: DO NOT APPLY any lotions, deodorants, cologne, or perfumes.   Do not bring valuables to the hospital. [facility] is not responsible for any belongings/valuables. Do not wear nail polish, gel polish, artificial nails, or any other type of covering on natural nails (fingers and toes) Do not wear jewelry or makeup Put on clean/comfortable clothes.  Please brush your teeth.  Ask your nurse before applying any prescription medications to the skin.     CHG Compatible Lotions   Aveeno Moisturizing lotion  Cetaphil Moisturizing Cream  Cetaphil Moisturizing Lotion  Clairol Herbal Essence Moisturizing Lotion, Dry Skin  Clairol Herbal Essence Moisturizing  Lotion, Extra Dry Skin  Clairol Herbal Essence Moisturizing Lotion, Normal Skin  Curel Age Defying Therapeutic Moisturizing Lotion with Alpha Hydroxy  Curel Extreme Care Body Lotion  Curel Soothing Hands Moisturizing Hand Lotion  Curel Therapeutic Moisturizing Cream, Fragrance-Free  Curel Therapeutic Moisturizing Lotion, Fragrance-Free  Curel Therapeutic Moisturizing Lotion, Original Formula  Eucerin Daily Replenishing Lotion  Eucerin Dry Skin Therapy Plus Alpha Hydroxy Crme  Eucerin Dry Skin Therapy Plus Alpha Hydroxy Lotion  Eucerin Original Crme  Eucerin Original Lotion  Eucerin Plus Crme Eucerin Plus Lotion  Eucerin TriLipid Replenishing Lotion  Keri Anti-Bacterial Hand Lotion  Keri Deep Conditioning Original Lotion Dry Skin Formula Softly Scented  Keri Deep Conditioning Original Lotion, Fragrance Free Sensitive Skin Formula  Keri Lotion Fast Absorbing Fragrance Free Sensitive Skin Formula  Keri Lotion Fast Absorbing Softly Scented Dry Skin Formula  Keri Original Lotion  Keri Skin Renewal Lotion Keri Silky Smooth Lotion  Keri Silky Smooth Sensitive Skin Lotion  Nivea Body Creamy Conditioning Oil  Nivea Body Extra Enriched Teacher, adult education Moisturizing Lotion Nivea Crme  Nivea Skin Firming Lotion  NutraDerm 30 Skin Lotion  NutraDerm Skin Lotion  NutraDerm Therapeutic Skin Cream  NutraDerm Therapeutic Skin Lotion  ProShield Protective Hand Cream  Provon moisturizing lotion  Please read over the following fact sheets that you were given.

## 2023-06-24 DIAGNOSIS — M222X1 Patellofemoral disorders, right knee: Secondary | ICD-10-CM | POA: Diagnosis not present

## 2023-06-30 ENCOUNTER — Ambulatory Visit (HOSPITAL_COMMUNITY)
Admission: RE | Admit: 2023-06-30 | Discharge: 2023-06-30 | Disposition: A | Discharge status: Home / Self Care | Attending: Orthopedic Surgery | Admitting: Orthopedic Surgery

## 2023-06-30 ENCOUNTER — Ambulatory Visit (HOSPITAL_COMMUNITY): Admitting: Anesthesiology

## 2023-06-30 ENCOUNTER — Encounter (HOSPITAL_COMMUNITY)
Admission: RE | Disposition: A | Discharge status: Home / Self Care | Payer: Self-pay | Source: Home / Self Care | Attending: Orthopedic Surgery

## 2023-06-30 ENCOUNTER — Ambulatory Visit (HOSPITAL_COMMUNITY)

## 2023-06-30 ENCOUNTER — Ambulatory Visit (HOSPITAL_BASED_OUTPATIENT_CLINIC_OR_DEPARTMENT_OTHER): Admitting: Anesthesiology

## 2023-06-30 ENCOUNTER — Other Ambulatory Visit: Payer: Self-pay

## 2023-06-30 ENCOUNTER — Encounter (HOSPITAL_COMMUNITY): Payer: Self-pay | Admitting: Orthopedic Surgery

## 2023-06-30 DIAGNOSIS — M5117 Intervertebral disc disorders with radiculopathy, lumbosacral region: Secondary | ICD-10-CM | POA: Diagnosis not present

## 2023-06-30 DIAGNOSIS — M5416 Radiculopathy, lumbar region: Secondary | ICD-10-CM | POA: Diagnosis not present

## 2023-06-30 DIAGNOSIS — Z4789 Encounter for other orthopedic aftercare: Secondary | ICD-10-CM | POA: Diagnosis not present

## 2023-06-30 HISTORY — DX: Attention-deficit hyperactivity disorder, unspecified type: F90.9

## 2023-06-30 HISTORY — PX: LUMBAR LAMINECTOMY/DECOMPRESSION MICRODISCECTOMY: SHX5026

## 2023-06-30 LAB — POCT PREGNANCY, URINE: Preg Test, Ur: NEGATIVE

## 2023-06-30 SURGERY — LUMBAR LAMINECTOMY/DECOMPRESSION MICRODISCECTOMY 1 LEVEL
Anesthesia: General | Site: Spine Lumbar | Laterality: Right

## 2023-06-30 MED ORDER — BUPIVACAINE-EPINEPHRINE (PF) 0.25% -1:200000 IJ SOLN
INTRAMUSCULAR | Status: AC
  Filled 2023-06-30: qty 30

## 2023-06-30 MED ORDER — ONDANSETRON HCL 4 MG/2ML IJ SOLN
INTRAMUSCULAR | Status: DC | PRN
  Administered 2023-06-30: 4 mg via INTRAVENOUS

## 2023-06-30 MED ORDER — FENTANYL CITRATE (PF) 250 MCG/5ML IJ SOLN
INTRAMUSCULAR | Status: DC | PRN
  Administered 2023-06-30 (×2): 100 ug via INTRAVENOUS
  Administered 2023-06-30: 50 ug via INTRAVENOUS

## 2023-06-30 MED ORDER — LIDOCAINE 2% (20 MG/ML) 5 ML SYRINGE
INTRAMUSCULAR | Status: DC | PRN
Start: 2023-06-30 — End: 2023-06-30
  Administered 2023-06-30: 60 mg via INTRAVENOUS

## 2023-06-30 MED ORDER — OXYCODONE HCL 5 MG PO TABS
5.0000 mg | ORAL_TABLET | Freq: Once | ORAL | Status: AC | PRN
  Administered 2023-06-30: 5 mg via ORAL

## 2023-06-30 MED ORDER — LACTATED RINGERS IV SOLN
INTRAVENOUS | Status: DC
Start: 2023-06-30 — End: 2023-06-30

## 2023-06-30 MED ORDER — ACETAMINOPHEN 10 MG/ML IV SOLN
INTRAVENOUS | Status: AC
  Filled 2023-06-30: qty 100

## 2023-06-30 MED ORDER — MIDAZOLAM HCL 2 MG/2ML IJ SOLN
INTRAMUSCULAR | Status: AC
  Filled 2023-06-30: qty 2

## 2023-06-30 MED ORDER — HYDROCODONE-ACETAMINOPHEN 5-325 MG PO TABS: 1.0000 | ORAL_TABLET | Freq: Four times a day (QID) | ORAL | 0 refills | Status: AC | PRN

## 2023-06-30 MED ORDER — FENTANYL CITRATE (PF) 100 MCG/2ML IJ SOLN: 25.0000 ug | INTRAMUSCULAR | Status: DC | PRN

## 2023-06-30 MED ORDER — FENTANYL CITRATE (PF) 250 MCG/5ML IJ SOLN
INTRAMUSCULAR | Status: AC
  Filled 2023-06-30: qty 5

## 2023-06-30 MED ORDER — METHYLPREDNISOLONE ACETATE 40 MG/ML IJ SUSP
INTRAMUSCULAR | Status: AC
  Filled 2023-06-30: qty 1

## 2023-06-30 MED ORDER — METHYLPREDNISOLONE ACETATE 40 MG/ML IJ SUSP
INTRAMUSCULAR | Status: DC | PRN
  Administered 2023-06-30: 40 mg

## 2023-06-30 MED ORDER — SUGAMMADEX SODIUM 200 MG/2ML IV SOLN
INTRAVENOUS | Status: DC | PRN
  Administered 2023-06-30: 200 mg via INTRAVENOUS

## 2023-06-30 MED ORDER — MIDAZOLAM HCL 2 MG/2ML IJ SOLN
INTRAMUSCULAR | Status: DC | PRN
  Administered 2023-06-30: 2 mg via INTRAVENOUS

## 2023-06-30 MED ORDER — PROPOFOL 10 MG/ML IV BOLUS
INTRAVENOUS | Status: AC
  Filled 2023-06-30: qty 20

## 2023-06-30 MED ORDER — PROPOFOL 500 MG/50ML IV EMUL
INTRAVENOUS | Status: DC | PRN
  Administered 2023-06-30: 125 ug/kg/min via INTRAVENOUS

## 2023-06-30 MED ORDER — CHLORHEXIDINE GLUCONATE 0.12 % MT SOLN
15.0000 mL | Freq: Once | OROMUCOSAL | Status: AC
  Administered 2023-06-30: 15 mL via OROMUCOSAL
  Filled 2023-06-30: qty 15

## 2023-06-30 MED ORDER — CEFAZOLIN SODIUM-DEXTROSE 2-4 GM/100ML-% IV SOLN
2.0000 g | INTRAVENOUS | Status: AC
  Administered 2023-06-30: 2 g via INTRAVENOUS
  Filled 2023-06-30: qty 100

## 2023-06-30 MED ORDER — ORAL CARE MOUTH RINSE: 15.0000 mL | Freq: Once | OROMUCOSAL | Status: AC

## 2023-06-30 MED ORDER — DEXMEDETOMIDINE HCL IN NACL 80 MCG/20ML IV SOLN
INTRAVENOUS | Status: DC | PRN
  Administered 2023-06-30: 8 ug via INTRAVENOUS

## 2023-06-30 MED ORDER — ROCURONIUM BROMIDE 10 MG/ML (PF) SYRINGE
PREFILLED_SYRINGE | INTRAVENOUS | Status: DC | PRN
  Administered 2023-06-30 (×2): 20 mg via INTRAVENOUS
  Administered 2023-06-30: 60 mg via INTRAVENOUS

## 2023-06-30 MED ORDER — ACETAMINOPHEN 500 MG PO TABS
1000.0000 mg | ORAL_TABLET | Freq: Once | ORAL | Status: AC
  Administered 2023-06-30: 1000 mg via ORAL

## 2023-06-30 MED ORDER — OXYCODONE HCL 5 MG PO TABS
ORAL_TABLET | ORAL | Status: DC
Start: 2023-06-30 — End: 2023-06-30
  Filled 2023-06-30: qty 1

## 2023-06-30 MED ORDER — BUPIVACAINE-EPINEPHRINE (PF) 0.25% -1:200000 IJ SOLN
INTRAMUSCULAR | Status: DC | PRN
  Administered 2023-06-30: 40 mL

## 2023-06-30 MED ORDER — 0.9 % SODIUM CHLORIDE (POUR BTL) OPTIME
TOPICAL | Status: DC | PRN
  Administered 2023-06-30: 1000 mL

## 2023-06-30 MED ORDER — PROPOFOL 10 MG/ML IV BOLUS
INTRAVENOUS | Status: DC | PRN
  Administered 2023-06-30: 50 mg via INTRAVENOUS
  Administered 2023-06-30: 200 mg via INTRAVENOUS
  Administered 2023-06-30: 50 mg via INTRAVENOUS

## 2023-06-30 MED ORDER — POVIDONE-IODINE 7.5 % EX SOLN
Freq: Once | CUTANEOUS | Status: AC
  Filled 2023-06-30: qty 118

## 2023-06-30 MED ORDER — THROMBIN 20000 UNITS EX SOLR
CUTANEOUS | Status: AC
Start: 2023-06-30 — End: ?
  Filled 2023-06-30: qty 20000

## 2023-06-30 MED ORDER — THROMBIN 20000 UNITS EX SOLR
CUTANEOUS | Status: DC | PRN
  Administered 2023-06-30: 20 mL

## 2023-06-30 MED ORDER — METHOCARBAMOL 500 MG PO TABS: 500.0000 mg | ORAL_TABLET | Freq: Four times a day (QID) | ORAL | 0 refills | Status: AC

## 2023-06-30 MED ORDER — DROPERIDOL 2.5 MG/ML IJ SOLN: 0.6250 mg | Freq: Once | INTRAMUSCULAR | Status: DC | PRN

## 2023-06-30 MED ORDER — GLYCOPYRROLATE PF 0.2 MG/ML IJ SOSY
PREFILLED_SYRINGE | INTRAMUSCULAR | Status: AC
  Filled 2023-06-30: qty 1

## 2023-06-30 MED ORDER — BUPIVACAINE-EPINEPHRINE 0.25% -1:200000 IJ SOLN
INTRAMUSCULAR | Status: DC | PRN
  Administered 2023-06-30: 10 mL

## 2023-06-30 MED ORDER — OXYCODONE HCL 5 MG/5ML PO SOLN: 5.0000 mg | Freq: Once | ORAL | Status: AC | PRN

## 2023-06-30 MED ORDER — BUPIVACAINE LIPOSOME 1.3 % IJ SUSP
INTRAMUSCULAR | Status: AC
  Filled 2023-06-30: qty 20

## 2023-06-30 SURGICAL SUPPLY — 60 items
BAG COUNTER SPONGE SURGICOUNT (BAG) ×2 IMPLANT
BENZOIN TINCTURE PRP APPL 2/3 (GAUZE/BANDAGES/DRESSINGS) IMPLANT
BNDG GAUZE DERMACEA FLUFF 4 (GAUZE/BANDAGES/DRESSINGS) IMPLANT
BUR ROUND PRECISION 4.0 (BURR) ×2 IMPLANT
CABLE BIPOLOR RESECTION CORD (MISCELLANEOUS) ×2 IMPLANT
CANISTER SUCT 3000ML PPV (MISCELLANEOUS) ×2 IMPLANT
COVER SURGICAL LIGHT HANDLE (MISCELLANEOUS) ×2 IMPLANT
DRAIN CHANNEL 15F RND FF W/TCR (WOUND CARE) IMPLANT
DRAPE POUCH INSTRU U-SHP 10X18 (DRAPES) ×4 IMPLANT
DRAPE SURG 17X23 STRL (DRAPES) ×8 IMPLANT
DURAPREP 26ML APPLICATOR (WOUND CARE) ×2 IMPLANT
ELECT CAUTERY BLADE 6.4 (BLADE) ×2 IMPLANT
ELECTRODE BLDE 4.0 EZ CLN MEGD (MISCELLANEOUS) IMPLANT
ELECTRODE REM PT RTRN 9FT ADLT (ELECTROSURGICAL) ×2 IMPLANT
EVACUATOR SILICONE 100CC (DRAIN) IMPLANT
FILTER STRAW FLUID ASPIR (MISCELLANEOUS) ×2 IMPLANT
GAUZE 4X4 16PLY ~~LOC~~+RFID DBL (SPONGE) ×4 IMPLANT
GAUZE SPONGE 4X4 12PLY STRL (GAUZE/BANDAGES/DRESSINGS) ×2 IMPLANT
GLOVE BIO SURGEON STRL SZ 6.5 (GLOVE) ×2 IMPLANT
GLOVE BIO SURGEON STRL SZ8 (GLOVE) ×2 IMPLANT
GLOVE BIOGEL PI IND STRL 7.0 (GLOVE) ×2 IMPLANT
GLOVE BIOGEL PI IND STRL 8 (GLOVE) ×2 IMPLANT
GLOVE SURG ENC MOIS LTX SZ6.5 (GLOVE) ×2 IMPLANT
GOWN STRL REUS W/ TWL LRG LVL3 (GOWN DISPOSABLE) ×2 IMPLANT
GOWN STRL REUS W/ TWL XL LVL3 (GOWN DISPOSABLE) ×4 IMPLANT
IV CATH 14GX2 1/4 (CATHETERS) ×2 IMPLANT
KIT BASIN OR (CUSTOM PROCEDURE TRAY) ×2 IMPLANT
KIT POSITION SURG JACKSON T1 (MISCELLANEOUS) ×2 IMPLANT
KIT TURNOVER KIT B (KITS) ×2 IMPLANT
NDL 18GX1X1/2 (RX/OR ONLY) (NEEDLE) ×2 IMPLANT
NDL 22X1.5 STRL (OR ONLY) (MISCELLANEOUS) ×2 IMPLANT
NDL HYPO 25GX1X1/2 BEV (NEEDLE) ×2 IMPLANT
NDL SPNL 18GX3.5 QUINCKE PK (NEEDLE) ×4 IMPLANT
NEEDLE 18GX1X1/2 (RX/OR ONLY) (NEEDLE) ×1 IMPLANT
NEEDLE 22X1.5 STRL (OR ONLY) (MISCELLANEOUS) ×1 IMPLANT
NEEDLE HYPO 25GX1X1/2 BEV (NEEDLE) ×1 IMPLANT
NEEDLE SPNL 18GX3.5 QUINCKE PK (NEEDLE) ×2 IMPLANT
NS IRRIG 1000ML POUR BTL (IV SOLUTION) ×2 IMPLANT
PACK LAMINECTOMY ORTHO (CUSTOM PROCEDURE TRAY) ×2 IMPLANT
PACK UNIVERSAL I (CUSTOM PROCEDURE TRAY) ×2 IMPLANT
PAD ARMBOARD POSITIONER FOAM (MISCELLANEOUS) ×4 IMPLANT
PATTIES SURGICAL .5 X.5 (GAUZE/BANDAGES/DRESSINGS) IMPLANT
PATTIES SURGICAL .5 X1 (DISPOSABLE) ×2 IMPLANT
SPONGE INTESTINAL PEANUT (DISPOSABLE) ×2 IMPLANT
SPONGE SURGIFOAM ABS GEL SZ50 (HEMOSTASIS) ×2 IMPLANT
STRIP CLOSURE SKIN 1/2X4 (GAUZE/BANDAGES/DRESSINGS) IMPLANT
SURGIFLO W/THROMBIN 8M KIT (HEMOSTASIS) IMPLANT
SUT MNCRL AB 4-0 PS2 18 (SUTURE) ×2 IMPLANT
SUT VIC AB 0 CT1 18XCR BRD 8 (SUTURE) IMPLANT
SUT VIC AB 1 CT1 18XCR BRD 8 (SUTURE) ×2 IMPLANT
SUT VIC AB 2-0 CT2 18 VCP726D (SUTURE) ×2 IMPLANT
SYR 20ML LL LF (SYRINGE) IMPLANT
SYR BULB IRRIG 60ML STRL (SYRINGE) ×2 IMPLANT
SYR CONTROL 10ML LL (SYRINGE) ×4 IMPLANT
SYR TB 1ML LUER SLIP (SYRINGE) ×8 IMPLANT
TAPE CLOTH 4X10 WHT NS (GAUZE/BANDAGES/DRESSINGS) IMPLANT
TOWEL GREEN STERILE (TOWEL DISPOSABLE) ×2 IMPLANT
TOWEL GREEN STERILE FF (TOWEL DISPOSABLE) ×2 IMPLANT
WATER STERILE IRR 1000ML POUR (IV SOLUTION) ×2 IMPLANT
YANKAUER SUCT BULB TIP NO VENT (SUCTIONS) ×2 IMPLANT

## 2023-06-30 NOTE — H&P (Signed)
     PREOPERATIVE H&P  Chief Complaint: Right leg pain  HPI: Renee Clark is a 43 y.o. female who presents with ongoing pain in the right leg  MRI reveals a right-sided disc herniation at L5-S1, with compression of the right S1 nerve  Patient has failed multiple forms of conservative care and continues to have pain (see office notes for additional details regarding the patient's full course of treatment)  Past Medical History:  Diagnosis Date   HSV (herpes simplex virus) infection    Past Surgical History:  Procedure Laterality Date   HYSTEROSCOPY     INTRAUTERINE DEVICE INSERTION  2012   paraguard   Social History   Socioeconomic History   Marital status: Single    Spouse name: Not on file   Number of children: Not on file   Years of education: Not on file   Highest education level: Not on file  Occupational History   Not on file  Tobacco Use   Smoking status: Never   Smokeless tobacco: Never  Vaping Use   Vaping status: Never Used  Substance and Sexual Activity   Alcohol use: Yes    Comment: socially 1/wk   Drug use: No    Comment: medicinal cannabis oil 4-5/week   Sexual activity: Yes    Partners: Male    Birth control/protection: Condom  Other Topics Concern   Not on file  Social History Narrative   Not on file   Social Drivers of Health   Financial Resource Strain: Not on file  Food Insecurity: Not on file  Transportation Needs: Not on file  Physical Activity: Not on file  Stress: Not on file  Social Connections: Not on file   Family History  Problem Relation Age of Onset   Breast cancer Maternal Aunt    Hypertension Mother    Diabetes Mother        prediabetic   Diabetes Maternal Grandfather    Hypertension Maternal Grandfather    Healthy Father    Pulmonary embolism Brother    No Known Allergies Prior to Admission medications   Medication Sig Start Date End Date Taking? Authorizing Provider  cyclobenzaprine (FLEXERIL) 10 MG tablet  Take 10 mg by mouth daily. 05/16/23  Yes [provider]  escitalopram (LEXAPRO) 20 MG tablet Take 20 mg by mouth daily. 04/16/23  Yes [provider]  gabapentin (NEURONTIN) 300 MG capsule Take 300 mg by mouth at bedtime as needed (pain). 05/12/23  Yes [provider]  meloxicam (MOBIC) 15 MG tablet Take 15 mg by mouth daily. 03/29/23  Yes [provider]  VYVANSE 50 MG capsule Take 50 mg by mouth every morning. 05/28/23  Yes [provider]     All other systems have been reviewed and were otherwise negative with the exception of those mentioned in the HPI and as above.  Physical Exam: There were no vitals filed for this visit.  There is no height or weight on file to calculate BMI.  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

## 2023-06-30 NOTE — Op Note (Signed)
 PATIENT NAME: Renee Clark   MEDICAL RECORD NO.:   [id number]    DATE OF BIRTH: [birth year]   DATE OF PROCEDURE: 06/30/2023                                OPERATIVE REPORT     PREOPERATIVE DIAGNOSES: 1. Right-sided S1 radiculopathy. 2. Large right-sided L5-S1 disk herniation causing severe     compression of the right S1 nerve.   POSTOPERATIVE DIAGNOSES: 1. Right-sided S1 radiculopathy. 2. Large right-sided L5-S1 disk herniation causing severe     compression of the right S1 nerve.   PROCEDURES:  Right-sided L5-S1 laminotomy with partial facetectomy and removal of large herniated right-sided L5-S1 disk fragment.   SURGEON:  Virl Grimes, MD.   ASSISTANTGeraline Knapp, PA-C.   ANESTHESIA:  General endotracheal anesthesia.   COMPLICATIONS:  None.   DISPOSITION:  Stable.   ESTIMATED BLOOD LOSS:  Minimal.   INDICATIONS FOR SURGERY:  Briefly, Renee Clark is a pleasant 43 year old female, who did present to me with severe pain in the right leg.  The patient's MRI did reveal the findings outlined above, clearly notable for a large herniated disk fragment. The pain was rather severe and she did have weakness in her leg as well.  We did discuss treatment options and we did ultimately elect to proceed with the procedure reflected above.  The patient was fully made aware of the risks of surgery, including the risk of recurrent herniation and the need for subsequent surgery, including the possibility of a subsequent diskectomy and/or fusion.   OPERATIVE DETAILS:  On 06/30/2023, the patient was brought to surgery and general endotracheal anesthesia was administered.  The patient was placed prone on a well-padded flat Jackson bed with a spinal frame.  Antibiotics were given.  The back was prepped and draped and a time-out procedure was performed.  At this point, a midline incision was made directly over the L5-S1 intervertebral space.  A curvilinear incision was made just to the  right of the midline into the fascia.  A self-retaining McCulloch retractor was placed.  The lamina of L5 and S1 was identified and subperiosteally exposed.  I then removed the lateral aspect of the L5-S1 ligamentum flavum.  I then used a high-speed bur to remove the medial and inferior aspect of the L5.  Kerrison punches were then used to perform a partial facetectomy at L5-S1.  Readily identified was the traversing right S1 nerve, which was noted to be under obvious tension, and was noted to be quite erythematous.  I was able to gently gain medial retraction of the nerve, and in doing so, a very large herniated disk fragment was readily noted.  I did use a 15 blade knife to perform an annulotomy at the posterolateral aspect of the disc herniation.  Through this annulotomy, abundant disc material was removed from the region of the lateral recess, and more centrally.  This did partially decompress the lateral recess on the right.  With additional palpation, it was evident that there was additional disc material more centrally.  I did use a reverse-angle Epstein curette in addition to an up-angled micropituitary to remove additional disc material that was identified more centrally.  In this fashion, I was able to decompress the spinal canal at the L5-S1 level.  There was additional more chronic-appearing disc material noted centrally, adherent to the vertebral bodies above and below it.  I  did not feel that additional attempts at removing the more chronic disc protrusions would bring with any benefit, and did not outweigh the risks associated with additional attempts at removal, as the nerve was noted to be decompressed at this point. The wound was copiously irrigated with normal saline.  All epidural bleeding was controlled using bipolar electrocautery in addition to Surgiflo. All bleeding was controlled at the termination of the procedure.  No extravasation of cerebrospinal fluid was noted throughout the  procedure.  At this point, 20 mg of Depo-Medrol was introduced about the epidural space in the region of the right S1 nerve.  The wound was then closed in layers using #1 Vicryl followed by 0 Vicryl, followed by 4-0 Monocryl. Benzoin and Steri-Strips were applied followed by a sterile dressing. All instrument counts were correct at the termination of the procedure.   Of note, Geraline Knapp was my assistant throughout surgery, and did aid in retraction, suctioning, and closure from start to finish.     Virl Grimes, MD

## 2023-06-30 NOTE — Transfer of Care (Signed)
 Immediate Anesthesia Transfer of Care Note  Patient: Renee Clark  Procedure(s) Performed: LUMBAR LAMINECTOMY/DECOMPRESSION MICRODISCECTOMY LUMBAR FIVE-SACRAL ONE (Right: Spine Lumbar)  Patient Location: PACU  Anesthesia Type:General  Level of Consciousness: awake, alert , and oriented  Airway & Oxygen Therapy: Patient Spontanous Breathing  Post-op Assessment: Report given to RN and Post -op Vital signs reviewed and stable  Post vital signs: Reviewed and stable  Last Vitals:  Vitals Value Taken Time  BP 133/85 06/30/23 1315  Temp 36.5 C 06/30/23 1258  Pulse 81 06/30/23 1319  Resp 19 06/30/23 1319  SpO2 96 % 06/30/23 1319  Vitals shown include unfiled device data.  Last Pain:  Vitals:   06/30/23 1315  TempSrc:   PainSc: 1       Patients Stated Pain Goal: 0 (06/30/23 0842)  Complications: No notable events documented.

## 2023-06-30 NOTE — Anesthesia Procedure Notes (Signed)
 Procedure Name: Intubation Date/Time: 06/30/2023 11:02 AM  Performed by: Marcelle Sergeant, CRNAPre-anesthesia Checklist: Patient identified, Emergency Drugs available, Suction available and Patient being monitored Patient Re-evaluated:Patient Re-evaluated prior to induction Oxygen Delivery Method: Circle system utilized Preoxygenation: Pre-oxygenation with 100% oxygen Induction Type: IV induction Ventilation: Mask ventilation without difficulty Laryngoscope Size: Mac and 4 Grade View: Grade II Tube type: Oral Tube size: 7.0 mm Number of attempts: 1 Airway Equipment and Method: Stylet and Oral airway Placement Confirmation: ETT inserted through vocal cords under direct vision, positive ETCO2 and breath sounds checked- equal and bilateral Secured at: 22 cm Tube secured with: Tape Dental Injury: Teeth and Oropharynx as per pre-operative assessment

## 2023-06-30 NOTE — Anesthesia Preprocedure Evaluation (Signed)
 Anesthesia Evaluation  Patient identified by MRN, date of birth, ID band Patient awake    Reviewed: Allergy & Precautions, NPO status , Patient's Chart, lab work & pertinent test results  Airway Mallampati: II  TM Distance: >3 FB Neck ROM: Full    Dental  (+) Dental Advisory Given   Pulmonary neg pulmonary ROS   breath sounds clear to auscultation       Cardiovascular negative cardio ROS  Rhythm:Regular Rate:Normal     Neuro/Psych negative neurological ROS     GI/Hepatic negative GI ROS, Neg liver ROS,,,  Endo/Other  negative endocrine ROS    Renal/GU negative Renal ROS     Musculoskeletal   Abdominal   Peds  Hematology negative hematology ROS (+)   Anesthesia Other Findings   Reproductive/Obstetrics                             Anesthesia Physical Anesthesia Plan  ASA: 1  Anesthesia Plan: General   Post-op Pain Management: Ofirmev  IV (intra-op)* and Toradol IV (intra-op)*   Induction: Intravenous  PONV Risk Score and Plan: 3 and Dexamethasone , Ondansetron , Midazolam and Treatment may vary due to age or medical condition  Airway Management Planned: Oral ETT  Additional Equipment:   Intra-op Plan:   Post-operative Plan: Extubation in OR  Informed Consent: I have reviewed the patients History and Physical, chart, labs and discussed the procedure including the risks, benefits and alternatives for the proposed anesthesia with the patient or authorized representative who has indicated his/her understanding and acceptance.     Dental advisory given  Plan Discussed with: CRNA  Anesthesia Plan Comments:        Anesthesia Quick Evaluation

## 2023-07-01 ENCOUNTER — Encounter (HOSPITAL_COMMUNITY): Payer: Self-pay | Admitting: Orthopedic Surgery

## 2023-07-02 NOTE — Anesthesia Postprocedure Evaluation (Signed)
 Anesthesia Post Note  Patient: Renee Clark  Procedure(s) Performed: LUMBAR LAMINECTOMY/DECOMPRESSION MICRODISCECTOMY LUMBAR FIVE-SACRAL ONE (Right: Spine Lumbar)     Patient location during evaluation: PACU Anesthesia Type: General Level of consciousness: awake and alert Pain management: pain level controlled Vital Signs Assessment: post-procedure vital signs reviewed and stable Respiratory status: spontaneous breathing, nonlabored ventilation, respiratory function stable and patient connected to nasal cannula oxygen Cardiovascular status: blood pressure returned to baseline and stable Postop Assessment: no apparent nausea or vomiting Anesthetic complications: no   No notable events documented.  Last Vitals:  Vitals:   06/30/23 1345 06/30/23 1400  BP: 121/81 116/83  Pulse: 66 64  Resp: 14 17  Temp:  36.5 C  SpO2: 100% 100%    Last Pain:  Vitals:   06/30/23 1400  TempSrc:   PainSc: 0-No pain   Pain Goal: Patients Stated Pain Goal: 0 (06/30/23 0842)                 Melvenia Stabs

## 2023-07-12 DIAGNOSIS — F411 Generalized anxiety disorder: Secondary | ICD-10-CM | POA: Diagnosis not present

## 2023-07-12 DIAGNOSIS — F900 Attention-deficit hyperactivity disorder, predominantly inattentive type: Secondary | ICD-10-CM | POA: Diagnosis not present

## 2023-07-12 DIAGNOSIS — F330 Major depressive disorder, recurrent, mild: Secondary | ICD-10-CM | POA: Diagnosis not present

## 2023-07-16 DIAGNOSIS — F4320 Adjustment disorder, unspecified: Secondary | ICD-10-CM | POA: Diagnosis not present

## 2023-07-22 ENCOUNTER — Other Ambulatory Visit: Payer: Self-pay | Admitting: *Deleted

## 2023-07-22 DIAGNOSIS — Z1231 Encounter for screening mammogram for malignant neoplasm of breast: Secondary | ICD-10-CM

## 2023-08-20 ENCOUNTER — Encounter

## 2023-08-20 DIAGNOSIS — Z1231 Encounter for screening mammogram for malignant neoplasm of breast: Secondary | ICD-10-CM

## 2023-09-06 ENCOUNTER — Other Ambulatory Visit: Payer: Self-pay | Admitting: *Deleted

## 2023-09-06 DIAGNOSIS — Z1231 Encounter for screening mammogram for malignant neoplasm of breast: Secondary | ICD-10-CM

## 2023-09-08 ENCOUNTER — Encounter

## 2023-09-08 DIAGNOSIS — Z1231 Encounter for screening mammogram for malignant neoplasm of breast: Secondary | ICD-10-CM

## 2023-09-09 ENCOUNTER — Ambulatory Visit
Admission: RE | Admit: 2023-09-09 | Discharge: 2023-09-09 | Disposition: A | Discharge status: Home / Self Care | Source: Ambulatory Visit | Attending: *Deleted | Admitting: *Deleted

## 2023-09-09 DIAGNOSIS — Z1231 Encounter for screening mammogram for malignant neoplasm of breast: Secondary | ICD-10-CM | POA: Diagnosis not present

## 2023-10-25 DIAGNOSIS — F330 Major depressive disorder, recurrent, mild: Secondary | ICD-10-CM | POA: Diagnosis not present

## 2023-10-25 DIAGNOSIS — F411 Generalized anxiety disorder: Secondary | ICD-10-CM | POA: Diagnosis not present

## 2023-10-25 DIAGNOSIS — F900 Attention-deficit hyperactivity disorder, predominantly inattentive type: Secondary | ICD-10-CM | POA: Diagnosis not present

## 2024-01-20 DIAGNOSIS — F411 Generalized anxiety disorder: Secondary | ICD-10-CM | POA: Diagnosis not present

## 2024-01-20 DIAGNOSIS — F900 Attention-deficit hyperactivity disorder, predominantly inattentive type: Secondary | ICD-10-CM | POA: Diagnosis not present

## 2024-01-20 DIAGNOSIS — F330 Major depressive disorder, recurrent, mild: Secondary | ICD-10-CM | POA: Diagnosis not present

## 2024-03-27 ENCOUNTER — Encounter: Admitting: Family Medicine

## 2024-05-23 ENCOUNTER — Encounter: Admitting: Obstetrics & Gynecology
